# Patient Record
Sex: Male | Born: 1985 | Race: White | Hispanic: No | Marital: Married | Smoking: Never smoker
Health system: Southern US, Community
[De-identification: ages and names within clinical notes are randomized; demographics above are authoritative.]

## PROBLEM LIST (undated history)

## (undated) DIAGNOSIS — R7301 Impaired fasting glucose: Secondary | ICD-10-CM

## (undated) DIAGNOSIS — E785 Hyperlipidemia, unspecified: Secondary | ICD-10-CM

## (undated) DIAGNOSIS — E7800 Pure hypercholesterolemia, unspecified: Secondary | ICD-10-CM

## (undated) HISTORY — DX: Impaired fasting glucose: R73.01

## (undated) HISTORY — DX: Pure hypercholesterolemia, unspecified: E78.00

## (undated) HISTORY — PX: HERNIA REPAIR: SHX51

## (undated) HISTORY — DX: Hyperlipidemia, unspecified: E78.5

---
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
## 2009-04-03 DEATH — deceased

## 2012-05-06 ENCOUNTER — Telehealth: Payer: Self-pay

## 2012-05-06 NOTE — Telephone Encounter (Signed)
PATIENT CALLED REQUESTING A COPY OF IMMUNIZATIONS AND PHYSICAL FORM FROM LAST YEAR (FOR EMS IN [facility]) THANK YOU! BEST NUMBER IS [phone]. PATIENT WORKS FOR [employer] AND SAYS HE NEEDS IT BY  TODAY IF POSSIBLE.

## 2012-05-06 NOTE — Telephone Encounter (Signed)
Left message for patient to call back  

## 2012-05-31 ENCOUNTER — Encounter (INDEPENDENT_AMBULATORY_CARE_PROVIDER_SITE_OTHER): Payer: Self-pay | Admitting: Surgery

## 2012-05-31 ENCOUNTER — Ambulatory Visit (INDEPENDENT_AMBULATORY_CARE_PROVIDER_SITE_OTHER): Payer: Worker's Compensation | Admitting: Surgery

## 2012-05-31 VITALS — BP 118/81 | HR 71 | Temp 98.2°F | Resp 14 | Ht 68.0 in | Wt 203.6 lb

## 2012-05-31 DIAGNOSIS — K429 Umbilical hernia without obstruction or gangrene: Secondary | ICD-10-CM | POA: Insufficient documentation

## 2012-05-31 NOTE — Progress Notes (Signed)
Patient ID: Ronnie Baker, male   DOB: 1986/04/06, 27 y.o.   MRN: [id number]  Chief Complaint  Patient presents with  . Hernia    HPI Ronnie Baker is a 27 y.o. male.   HPI This is a very pleasant gentleman who presents today for evaluation of an umbilical hernia. He  Is referred by [name] from urgent care. 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 He has had discomfort there since a lifting episode. The pain is sharp and moderate in intensity. He has had no nausea, vomiting, or obstructive symptoms. He has had bilateral inguinal hernias repaired as a child. History reviewed. No pertinent past medical history.  Past Surgical History  Procedure Date  . Hernia repair     x 2 hernia    History reviewed. No pertinent family history.  Social History History  Substance Use Topics  . Smoking status: Never Smoker   . Smokeless tobacco: Not on file  . Alcohol Use: Yes     Comment: rare    No Known Allergies  No current outpatient prescriptions on file.    Review of Systems Review of Systems  Constitutional: Negative for fever, chills and unexpected weight change.  HENT: Negative for hearing loss, congestion, sore throat, trouble swallowing and voice change.   Eyes: Negative for visual disturbance.  Respiratory: Negative for cough and wheezing.   Cardiovascular: Negative for chest pain, palpitations and leg swelling.  Gastrointestinal: Positive for abdominal pain. Negative for nausea, vomiting, diarrhea, constipation, blood in stool, abdominal distention, anal bleeding and rectal pain.  Genitourinary: Negative for hematuria and difficulty urinating.  Musculoskeletal: Negative for arthralgias.  Skin: Negative for rash and wound.  Neurological: Negative for seizures, syncope, weakness and headaches.  Hematological: Negative for adenopathy. Does not bruise/bleed easily.  Psychiatric/Behavioral: Negative for confusion.    Blood pressure  118/81, pulse 71, temperature 98.2 F (36.8 C), temperature source Temporal, resp. rate 14, height 5\' 8"  (1.727 m), weight 203 lb 9.6 oz (92.352 kg).  Physical Exam Physical Exam  Constitutional: He is oriented to person, place, and time. He appears well-developed and well-nourished. No distress.  Eyes: Conjunctivae normal are normal. Pupils are equal, round, and reactive to light. Right eye exhibits no discharge. Left eye exhibits no discharge. Scleral icterus is present.  Neck: Normal range of motion. Neck supple.  Cardiovascular: Normal rate, regular rhythm, normal heart sounds and intact distal pulses.   No murmur heard. Pulmonary/Chest: Breath sounds normal. No respiratory distress. He has no wheezes.  Abdominal: Soft. Bowel sounds are normal. He exhibits no distension. There is no tenderness.       There is a small, reducible hernia at the umbilicus  Neurological: He is alert and oriented to person, place, and time.  Skin: Skin is warm and dry. He is not diaphoretic.  Psychiatric: His behavior is normal. Judgment normal.    Data Reviewed   Assessment    Umbilical hernia    Plan    As he is symptomatic he does a significant amount of lifting, repair with mesh was recommended. I discussed this with him in detail. I discussed the risks of surgery which includes but is not limited to bleeding, infection, recurrence, et [name]. He understands and wishes to proceed. Likelihood of success is good.       [name] A 05/31/2012, 11:28 AM

## 2012-06-01 ENCOUNTER — Telehealth (INDEPENDENT_AMBULATORY_CARE_PROVIDER_SITE_OTHER): Payer: Self-pay

## 2012-06-01 ENCOUNTER — Telehealth (INDEPENDENT_AMBULATORY_CARE_PROVIDER_SITE_OTHER): Payer: Self-pay | Admitting: General Surgery

## 2012-06-01 ENCOUNTER — Encounter (INDEPENDENT_AMBULATORY_CARE_PROVIDER_SITE_OTHER): Payer: Self-pay

## 2012-06-01 NOTE — Telephone Encounter (Signed)
Called patient to see about if he needed a note for work and he stated that he did get a note for work

## 2012-06-01 NOTE — Telephone Encounter (Signed)
Patient called in asking for note for work and asked if he needed restrictions. After speaking with Dr Magnus Ivan I told Bowdy he did not need restriction. He asked if we received approval for surgery from workers comp. I told him I would have someone check up on it and call him back.

## 2012-06-03 ENCOUNTER — Encounter (HOSPITAL_COMMUNITY): Payer: Self-pay | Admitting: Pharmacist

## 2012-06-07 ENCOUNTER — Encounter (HOSPITAL_COMMUNITY): Payer: Self-pay

## 2012-06-07 MED ORDER — CEFAZOLIN SODIUM-DEXTROSE 2-3 GM-% IV SOLR
2.0000 g | INTRAVENOUS | Status: AC
  Administered 2012-06-08: 2 g via INTRAVENOUS
  Filled 2012-06-07 (×3): qty 50

## 2012-06-08 ENCOUNTER — Encounter (HOSPITAL_COMMUNITY): Payer: Self-pay | Admitting: Anesthesiology

## 2012-06-08 ENCOUNTER — Encounter (HOSPITAL_COMMUNITY)
Admission: RE | Disposition: A | Discharge status: Home / Self Care | Payer: Self-pay | Source: Ambulatory Visit | Attending: Surgery

## 2012-06-08 ENCOUNTER — Ambulatory Visit (HOSPITAL_COMMUNITY)
Admission: RE | Admit: 2012-06-08 | Discharge: 2012-06-08 | Disposition: A | Discharge status: Home / Self Care | Payer: Worker's Compensation | Source: Ambulatory Visit | Attending: Surgery | Admitting: Surgery

## 2012-06-08 ENCOUNTER — Ambulatory Visit (HOSPITAL_COMMUNITY): Payer: Worker's Compensation | Admitting: Anesthesiology

## 2012-06-08 DIAGNOSIS — K420 Umbilical hernia with obstruction, without gangrene: Secondary | ICD-10-CM | POA: Insufficient documentation

## 2012-06-08 DIAGNOSIS — K429 Umbilical hernia without obstruction or gangrene: Secondary | ICD-10-CM

## 2012-06-08 HISTORY — PX: INSERTION OF MESH: SHX5868

## 2012-06-08 HISTORY — PX: UMBILICAL HERNIA REPAIR: SHX196

## 2012-06-08 LAB — CBC
Hemoglobin: 14.6 g/dL (ref 13.0–17.0)
MCH: 27.9 pg (ref 26.0–34.0)
MCV: 81.1 fL (ref 78.0–100.0)
RBC: 5.24 MIL/uL (ref 4.22–5.81)

## 2012-06-08 LAB — SURGICAL PCR SCREEN: MRSA, PCR: NEGATIVE

## 2012-06-08 SURGERY — REPAIR, HERNIA, UMBILICAL, ADULT
Anesthesia: General | Wound class: Clean

## 2012-06-08 MED ORDER — OXYCODONE HCL 5 MG/5ML PO SOLN: 5.0000 mg | Freq: Once | ORAL | Status: DC | PRN

## 2012-06-08 MED ORDER — PROPOFOL 10 MG/ML IV BOLUS
INTRAVENOUS | Status: DC | PRN
  Administered 2012-06-08: 200 mg via INTRAVENOUS

## 2012-06-08 MED ORDER — BUPIVACAINE HCL (PF) 0.25 % IJ SOLN
INTRAMUSCULAR | Status: DC | PRN
  Administered 2012-06-08: 18 mL

## 2012-06-08 MED ORDER — ACETAMINOPHEN 650 MG RE SUPP
650.0000 mg | RECTAL | Status: DC | PRN
  Filled 2012-06-08: qty 1

## 2012-06-08 MED ORDER — MEPERIDINE HCL 25 MG/ML IJ SOLN: 6.2500 mg | INTRAMUSCULAR | Status: DC | PRN

## 2012-06-08 MED ORDER — ACETAMINOPHEN 325 MG PO TABS
650.0000 mg | ORAL_TABLET | ORAL | Status: DC | PRN
  Filled 2012-06-08: qty 2

## 2012-06-08 MED ORDER — PROMETHAZINE HCL 25 MG/ML IJ SOLN: 6.2500 mg | INTRAMUSCULAR | Status: DC | PRN

## 2012-06-08 MED ORDER — ONDANSETRON HCL 4 MG/2ML IJ SOLN
4.0000 mg | Freq: Four times a day (QID) | INTRAMUSCULAR | Status: DC | PRN
  Administered 2012-06-08: 4 mg via INTRAVENOUS

## 2012-06-08 MED ORDER — KETOROLAC TROMETHAMINE 30 MG/ML IJ SOLN
INTRAMUSCULAR | Status: DC | PRN
  Administered 2012-06-08: 30 mg via INTRAVENOUS

## 2012-06-08 MED ORDER — MUPIROCIN 2 % EX OINT
TOPICAL_OINTMENT | CUTANEOUS | Status: AC
  Administered 2012-06-08: 1 via NASAL
  Filled 2012-06-08: qty 22

## 2012-06-08 MED ORDER — LACTATED RINGERS IV SOLN
INTRAVENOUS | Status: DC | PRN
  Administered 2012-06-08: 08:00:00 via INTRAVENOUS

## 2012-06-08 MED ORDER — HYDROMORPHONE HCL PF 1 MG/ML IJ SOLN: 0.2500 mg | INTRAMUSCULAR | Status: DC | PRN

## 2012-06-08 MED ORDER — MIDAZOLAM HCL 5 MG/5ML IJ SOLN
INTRAMUSCULAR | Status: DC | PRN
  Administered 2012-06-08: 2 mg via INTRAVENOUS

## 2012-06-08 MED ORDER — OXYCODONE HCL 5 MG PO TABS: 5.0000 mg | ORAL_TABLET | Freq: Once | ORAL | Status: DC | PRN

## 2012-06-08 MED ORDER — MUPIROCIN 2 % EX OINT
TOPICAL_OINTMENT | Freq: Once | CUTANEOUS | Status: DC
  Filled 2012-06-08: qty 22

## 2012-06-08 MED ORDER — HYDROCODONE-ACETAMINOPHEN 5-325 MG PO TABS: 1.0000 | ORAL_TABLET | ORAL | Status: DC | PRN

## 2012-06-08 MED ORDER — OXYCODONE HCL 5 MG PO TABS
5.0000 mg | ORAL_TABLET | ORAL | Status: DC | PRN
  Administered 2012-06-08: 10 mg via ORAL

## 2012-06-08 MED ORDER — LIDOCAINE HCL (CARDIAC) 20 MG/ML IV SOLN
INTRAVENOUS | Status: DC | PRN
  Administered 2012-06-08: 80 mg via INTRAVENOUS

## 2012-06-08 MED ORDER — HYDROMORPHONE HCL PF 1 MG/ML IJ SOLN
INTRAMUSCULAR | Status: AC
  Filled 2012-06-08: qty 1

## 2012-06-08 MED ORDER — BUPIVACAINE HCL (PF) 0.25 % IJ SOLN
INTRAMUSCULAR | Status: AC
  Filled 2012-06-08: qty 30

## 2012-06-08 MED ORDER — FENTANYL CITRATE 0.05 MG/ML IJ SOLN
INTRAMUSCULAR | Status: DC | PRN
  Administered 2012-06-08 (×2): 50 ug via INTRAVENOUS
  Administered 2012-06-08: 100 ug via INTRAVENOUS

## 2012-06-08 MED ORDER — OXYCODONE HCL 5 MG PO TABS
ORAL_TABLET | ORAL | Status: AC
  Filled 2012-06-08: qty 2

## 2012-06-08 MED ORDER — ONDANSETRON HCL 4 MG/2ML IJ SOLN
INTRAMUSCULAR | Status: AC
  Filled 2012-06-08: qty 2

## 2012-06-08 MED ORDER — ONDANSETRON HCL 4 MG/2ML IJ SOLN
INTRAMUSCULAR | Status: DC | PRN
  Administered 2012-06-08: 4 mg via INTRAVENOUS

## 2012-06-08 MED ORDER — SODIUM CHLORIDE 0.9 % IJ SOLN: 3.0000 mL | Freq: Two times a day (BID) | INTRAMUSCULAR | Status: DC

## 2012-06-08 MED ORDER — SODIUM CHLORIDE 0.9 % IJ SOLN: 3.0000 mL | INTRAMUSCULAR | Status: DC | PRN

## 2012-06-08 MED ORDER — MORPHINE SULFATE 4 MG/ML IJ SOLN: 4.0000 mg | INTRAMUSCULAR | Status: DC | PRN

## 2012-06-08 MED ORDER — SODIUM CHLORIDE 0.9 % IV SOLN: 250.0000 mL | INTRAVENOUS | Status: DC | PRN

## 2012-06-08 MED ORDER — 0.9 % SODIUM CHLORIDE (POUR BTL) OPTIME
TOPICAL | Status: DC | PRN
  Administered 2012-06-08: 1000 mL

## 2012-06-08 SURGICAL SUPPLY — 44 items
APL SKNCLS STERI-STRIP NONHPOA (GAUZE/BANDAGES/DRESSINGS) ×1
BENZOIN TINCTURE PRP APPL 2/3 (GAUZE/BANDAGES/DRESSINGS) ×2 IMPLANT
BLADE SURG 10 STRL SS (BLADE) ×1 IMPLANT
BLADE SURG 15 STRL LF DISP TIS (BLADE) IMPLANT
BLADE SURG 15 STRL SS (BLADE) ×2
BLADE SURG ROTATE 9660 (MISCELLANEOUS) ×1 IMPLANT
CANISTER SUCTION 2500CC (MISCELLANEOUS) ×1 IMPLANT
CHLORAPREP W/TINT 26ML (MISCELLANEOUS) ×2 IMPLANT
CLOTH BEACON ORANGE TIMEOUT ST (SAFETY) ×2 IMPLANT
COVER SURGICAL LIGHT HANDLE (MISCELLANEOUS) ×2 IMPLANT
DECANTER SPIKE VIAL GLASS SM (MISCELLANEOUS) ×2 IMPLANT
DRAPE PED LAPAROTOMY (DRAPES) ×2 IMPLANT
DRSG TEGADERM 4X4.75 (GAUZE/BANDAGES/DRESSINGS) ×2 IMPLANT
ELECT CAUTERY BLADE 6.4 (BLADE) ×2 IMPLANT
ELECT REM PT RETURN 9FT ADLT (ELECTROSURGICAL) ×2
ELECTRODE REM PT RTRN 9FT ADLT (ELECTROSURGICAL) ×1 IMPLANT
GAUZE SPONGE 2X2 8PLY STRL LF (GAUZE/BANDAGES/DRESSINGS) ×1 IMPLANT
GLOVE BIOGEL PI IND STRL 7.0 (GLOVE) IMPLANT
GLOVE BIOGEL PI INDICATOR 7.0 (GLOVE) ×2
GLOVE SURG SIGNA 7.5 PF LTX (GLOVE) ×2 IMPLANT
GLOVE SURG SS PI 7.0 STRL IVOR (GLOVE) ×1 IMPLANT
GOWN PREVENTION PLUS XLARGE (GOWN DISPOSABLE) ×2 IMPLANT
GOWN STRL NON-REIN LRG LVL3 (GOWN DISPOSABLE) ×2 IMPLANT
KIT BASIN OR (CUSTOM PROCEDURE TRAY) ×2 IMPLANT
KIT ROOM TURNOVER OR (KITS) ×2 IMPLANT
NDL HYPO 25GX1X1/2 BEV (NEEDLE) ×1 IMPLANT
NEEDLE HYPO 25GX1X1/2 BEV (NEEDLE) ×2 IMPLANT
NS IRRIG 1000ML POUR BTL (IV SOLUTION) ×2 IMPLANT
PACK SURGICAL SETUP 50X90 (CUSTOM PROCEDURE TRAY) ×2 IMPLANT
PAD ARMBOARD 7.5X6 YLW CONV (MISCELLANEOUS) ×2 IMPLANT
PATCH VENTRAL SMALL 4.3 (Mesh Specialty) ×1 IMPLANT
PENCIL BUTTON HOLSTER BLD 10FT (ELECTRODE) ×2 IMPLANT
SPONGE GAUZE 2X2 STER 10/PKG (GAUZE/BANDAGES/DRESSINGS) ×1
STRIP CLOSURE SKIN 1/2X4 (GAUZE/BANDAGES/DRESSINGS) ×2 IMPLANT
SUT MNCRL AB 4-0 PS2 18 (SUTURE) ×2 IMPLANT
SUT NOVA NAB DX-16 0-1 5-0 T12 (SUTURE) ×3 IMPLANT
SUT VIC AB 3-0 SH 27 (SUTURE) ×2
SUT VIC AB 3-0 SH 27X BRD (SUTURE) ×1 IMPLANT
SYR BULB 3OZ (MISCELLANEOUS) ×1 IMPLANT
SYR CONTROL 10ML LL (SYRINGE) ×2 IMPLANT
TOWEL OR 17X24 6PK STRL BLUE (TOWEL DISPOSABLE) ×2 IMPLANT
TOWEL OR 17X26 10 PK STRL BLUE (TOWEL DISPOSABLE) ×2 IMPLANT
TUBE CONNECTING 12X1/4 (SUCTIONS) ×1 IMPLANT
YANKAUER SUCT BULB TIP NO VENT (SUCTIONS) ×1 IMPLANT

## 2012-06-08 NOTE — Interval H&P Note (Signed)
History and Physical Interval Note:  06/08/2012 7:20 AM  Ronnie Baker  has presented today for surgery, with the diagnosis of umbilical hernia  The various methods of treatment have been discussed with the patient and family. After consideration of risks, benefits and other options for treatment, the patient has consented to  Procedure(s) (LRB) with comments: HERNIA REPAIR UMBILICAL ADULT (N/A) INSERTION OF MESH (N/A) as a surgical intervention .  The patient's history has been reviewed, patient examined, no change in status, stable for surgery.  I have reviewed the patient's chart and labs.  Questions were answered to the patient's satisfaction.     Kaylin Schellenberg A

## 2012-06-08 NOTE — Transfer of Care (Signed)
Immediate Anesthesia Transfer of Care Note  Patient: Ronnie Baker  Procedure(s) Performed: Procedure(s) (LRB) with comments: HERNIA REPAIR UMBILICAL ADULT (N/A) INSERTION OF MESH (N/A)  Patient Location: PACU  Anesthesia Type:General  Level of Consciousness: awake, sedated and patient cooperative  Airway & Oxygen Therapy: Patient Spontanous Breathing and Patient connected to nasal cannula oxygen  Post-op Assessment: Report given to PACU RN, Patient moving all extremities and Patient moving all extremities X 4  Post vital signs: Reviewed and stable  Complications: No apparent anesthesia complications

## 2012-06-08 NOTE — Anesthesia Preprocedure Evaluation (Signed)
Anesthesia Evaluation  Patient identified by MRN, date of birth, ID band Patient awake    Reviewed: Allergy & Precautions, H&P , NPO status , Patient's Chart, lab work & pertinent test results  History of Anesthesia Complications Negative for: history of anesthetic complications  Airway Mallampati: I      Dental No notable dental hx. (+) Teeth Intact   Pulmonary neg pulmonary ROS,  breath sounds clear to auscultation  Pulmonary exam normal       Cardiovascular negative cardio ROS  IRhythm:regular Rate:Normal     Neuro/Psych negative neurological ROS  negative psych ROS   GI/Hepatic negative GI ROS, Neg liver ROS,   Endo/Other  negative endocrine ROS  Renal/GU negative Renal ROS  negative genitourinary   Musculoskeletal   Abdominal   Peds  Hematology negative hematology ROS (+)   Anesthesia Other Findings   Reproductive/Obstetrics negative OB ROS                           Anesthesia Physical Anesthesia Plan  ASA: I  Anesthesia Plan: General and General LMA   Post-op Pain Management:    Induction:   Airway Management Planned:   Additional Equipment:   Intra-op Plan:   Post-operative Plan:   Informed Consent: I have reviewed the patients History and Physical, chart, labs and discussed the procedure including the risks, benefits and alternatives for the proposed anesthesia with the patient or authorized representative who has indicated his/her understanding and acceptance.     Plan Discussed with: CRNA and Surgeon  Anesthesia Plan Comments:         Anesthesia Quick Evaluation

## 2012-06-08 NOTE — Anesthesia Procedure Notes (Signed)
Procedure Name: LMA Insertion Date/Time: 06/08/2012 8:33 AM Performed by: Coralee Rud Pre-anesthesia Checklist: Patient identified, Emergency Drugs available, Suction available and Patient being monitored Patient Re-evaluated:Patient Re-evaluated prior to inductionOxygen Delivery Method: Circle system utilized Preoxygenation: Pre-oxygenation with 100% oxygen Intubation Type: IV induction Ventilation: Mask ventilation without difficulty LMA: LMA inserted LMA Size: 5.0 Number of attempts: 1 Dental Injury: Teeth and Oropharynx as per pre-operative assessment

## 2012-06-08 NOTE — H&P (Signed)
  Patient ID: Ronnie Baker, male DOB: [birth year], 27 y.o. MRN: [id number]  Chief Complaint   Patient presents with   .  Hernia    HPI  Ronnie Baker is a 27 y.o. male.  HPI  This is a very pleasant gentleman who presents today for evaluation of an umbilical hernia. He Is referred by [name] from urgent care. 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 He has had discomfort there since a lifting episode. The pain is sharp and moderate in intensity. He has had no nausea, vomiting, or obstructive symptoms. He has had bilateral inguinal hernias repaired as a child.  History reviewed. No pertinent past medical history.  Past Surgical History   Procedure  Date   .  Hernia repair      x 2 hernia    History reviewed. No pertinent family history.  Social History  History   Substance Use Topics   .  Smoking status:  Never Smoker   .  Smokeless tobacco:  Not on file   .  Alcohol Use:  Yes      Comment: rare    No Known Allergies  No current outpatient prescriptions on file.    Review of Systems  Review of Systems  Constitutional: Negative for fever, chills and unexpected weight change.  HENT: Negative for hearing loss, congestion, sore throat, trouble swallowing and voice change.  Eyes: Negative for visual disturbance.  Respiratory: Negative for cough and wheezing.  Cardiovascular: Negative for chest pain, palpitations and leg swelling.  Gastrointestinal: Positive for abdominal pain. Negative for nausea, vomiting, diarrhea, constipation, blood in stool, abdominal distention, anal bleeding and rectal pain.  Genitourinary: Negative for hematuria and difficulty urinating.  Musculoskeletal: Negative for arthralgias.  Skin: Negative for rash and wound.  Neurological: Negative for seizures, syncope, weakness and headaches.  Hematological: Negative for adenopathy. Does not bruise/bleed easily.  Psychiatric/Behavioral: Negative for confusion.   Blood  pressure 118/81, pulse 71, temperature 98.2 F (36.8 C), temperature source Temporal, resp. rate 14, height 5\' 8"  (1.727 m), weight 203 lb 9.6 oz (92.352 kg).  Physical Exam  Physical Exam  Constitutional: He is oriented to person, place, and time. He appears well-developed and well-nourished. No distress.  Eyes: Conjunctivae normal are normal. Pupils are equal, round, and reactive to light. Right eye exhibits no discharge. Left eye exhibits no discharge. Scleral icterus is present.  Neck: Normal range of motion. Neck supple.  Cardiovascular: Normal rate, regular rhythm, normal heart sounds and intact distal pulses.  No murmur heard.  Pulmonary/Chest: Breath sounds normal. No respiratory distress. He has no wheezes.  Abdominal: Soft. Bowel sounds are normal. He exhibits no distension. There is no tenderness.  There is a small, reducible hernia at the umbilicus  Neurological: He is alert and oriented to person, place, and time.  Skin: Skin is warm and dry. He is not diaphoretic.  Psychiatric: His behavior is normal. Judgment normal.   Data Reviewed  Assessment   Umbilical hernia   Plan   As he is symptomatic he does a significant amount of lifting, repair with mesh was recommended. I discussed this with him in detail. I discussed the risks of surgery which includes but is not limited to bleeding, infection, recurrence, et [name]. He understands and wishes to proceed. Likelihood of success is good.   Caiden Arteaga A

## 2012-06-08 NOTE — Op Note (Signed)
HERNIA REPAIR UMBILICAL ADULT, INSERTION OF MESH  Procedure Note  Ronnie Baker 06/08/2012   Pre-op Diagnosis: umbilical hernia     Post-op Diagnosis: same  Procedure(s): HERNIA REPAIR UMBILICAL ADULT INSERTION OF MESH (4.3cm round V-patch)  Surgeon(s): Shelly Rubenstein, MD  Anesthesia: General  Staff:  Sudie Bailey, RN - Circulator Leighton Parody - Scrub Person  Estimated Blood Loss: Minimal               Procedure: The patient was brought to the operating room and identified as the correct patient. He was placed supine on the operating room table and general anesthesia was induced. His abdomen was then prepped and draped in the usual sterile fashion. I anesthetized the skin at the lower edge of the umbilicus with Marcaine. I then made Baker small transverse incision in the lower edge of the umbilicus with Baker scalpel. I took incision down to the fascia with the electrocautery. The patient had Baker very small less than 1 cm fascial defect with incarcerated preperitoneal fat which I excised. I then brought Baker 4.3 cm round V-patch onto the field. I placed the mesh through the fascial defect and then pulled up taut against the peritoneal surface with the stay ties. I then sewed the mesh in circumferentially with #1 Novafil sutures. I then cut the stay ties and closed the fascia over the top of the mesh with figure-of-eight #1 Novafil sutures. I anesthetized the fascia further with Marcaine. I then closed subcutaneous tissue with interrupted 3-0 Vicryl sutures and closed skin with Baker running 4-0 Monocryl. Steri-Strips, gauze, and and Tegaderm were then applied. The patient tolerated the procedure well. All counts were correct at the end of the procedure. The patient was then extubated in the operating room and taken in Baker stable condition to the recovery room.          Ronnie Baker   Date: 06/08/2012  Time: 9:12 AM

## 2012-06-08 NOTE — Anesthesia Postprocedure Evaluation (Signed)
  Anesthesia Post-op Note  Patient: Ronnie Baker  Procedure(s) Performed: Procedure(s) (LRB) with comments: HERNIA REPAIR UMBILICAL ADULT (N/A) INSERTION OF MESH (N/A)  Patient Location: PACU  Anesthesia Type:General  Level of Consciousness: awake  Airway and Oxygen Therapy: Patient Spontanous Breathing  Post-op Pain: mild  Post-op Assessment: Post-op Vital signs reviewed  Post-op Vital Signs: stable  Complications: No apparent anesthesia complications

## 2012-06-10 ENCOUNTER — Encounter (HOSPITAL_COMMUNITY): Payer: Self-pay | Admitting: Surgery

## 2012-06-14 ENCOUNTER — Ambulatory Visit (INDEPENDENT_AMBULATORY_CARE_PROVIDER_SITE_OTHER): Payer: Worker's Compensation | Admitting: Surgery

## 2012-06-14 VITALS — BP 138/84 | HR 95 | Temp 97.0°F | Resp 18 | Ht 68.0 in | Wt 200.0 lb

## 2012-06-14 DIAGNOSIS — Z09 Encounter for follow-up examination after completed treatment for conditions other than malignant neoplasm: Secondary | ICD-10-CM

## 2012-06-14 NOTE — Progress Notes (Signed)
Subjective:     Patient ID: Ronnie Baker, male   DOB: 02/16/1986, 27 y.o.   MRN: [id number]  HPI He is here for his first postop visit status post umbilical hernia repair with mesh last week. He is doing well and has minimal discomfort  Review of Systems     Objective:   Physical Exam On exam, his incision is well healed with no evidence of recurrence or infection    Assessment:     Patient status post umbilical hernia repair with mesh     Plan:     He may return to work tomorrow to light activity. He will do no lifting greater than 15-20 pounds. He may return to full activity on March 5

## 2012-07-12 ENCOUNTER — Ambulatory Visit (INDEPENDENT_AMBULATORY_CARE_PROVIDER_SITE_OTHER): Payer: Worker's Compensation | Admitting: Surgery

## 2012-07-12 ENCOUNTER — Encounter (INDEPENDENT_AMBULATORY_CARE_PROVIDER_SITE_OTHER): Payer: Self-pay | Admitting: Surgery

## 2012-07-12 VITALS — BP 120/84 | HR 72 | Temp 97.0°F | Resp 16 | Ht 68.0 in | Wt 201.2 lb

## 2012-07-12 DIAGNOSIS — Z09 Encounter for follow-up examination after completed treatment for conditions other than malignant neoplasm: Secondary | ICD-10-CM

## 2012-07-12 NOTE — Progress Notes (Signed)
Subjective:     Patient ID: Ronnie Baker, male   DOB: 09-02-1985, 27 y.o.   MRN: [id number]  HPI He is here for his final postop check status post umbilical hernia repair with mesh in February. 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 He still has some mild discomfort.  Review of Systems     Objective:   Physical Exam On exam, his incision is well healed and there is no evidence of recurrent hernia    Assessment:     Patient stable postop     Plan:     He makes now return to full activities without limitations. I will see him back as needed

## 2013-06-11 ENCOUNTER — Ambulatory Visit (INDEPENDENT_AMBULATORY_CARE_PROVIDER_SITE_OTHER): Payer: 59 | Admitting: Emergency Medicine

## 2013-06-11 ENCOUNTER — Emergency Department (HOSPITAL_COMMUNITY)
Admission: EM | Admit: 2013-06-11 | Discharge: 2013-06-11 | Disposition: A | Discharge status: Home / Self Care | Payer: 59 | Attending: Emergency Medicine | Admitting: Emergency Medicine

## 2013-06-11 ENCOUNTER — Emergency Department (HOSPITAL_COMMUNITY): Payer: 59

## 2013-06-11 ENCOUNTER — Encounter (HOSPITAL_COMMUNITY): Payer: Self-pay | Admitting: Emergency Medicine

## 2013-06-11 VITALS — BP 112/78 | HR 113 | Temp 98.9°F | Resp 20 | Ht 67.0 in | Wt 200.4 lb

## 2013-06-11 DIAGNOSIS — R509 Fever, unspecified: Secondary | ICD-10-CM | POA: Insufficient documentation

## 2013-06-11 DIAGNOSIS — R109 Unspecified abdominal pain: Secondary | ICD-10-CM

## 2013-06-11 DIAGNOSIS — Z79899 Other long term (current) drug therapy: Secondary | ICD-10-CM | POA: Insufficient documentation

## 2013-06-11 DIAGNOSIS — R0602 Shortness of breath: Secondary | ICD-10-CM | POA: Insufficient documentation

## 2013-06-11 DIAGNOSIS — R112 Nausea with vomiting, unspecified: Secondary | ICD-10-CM | POA: Insufficient documentation

## 2013-06-11 DIAGNOSIS — Z9889 Other specified postprocedural states: Secondary | ICD-10-CM | POA: Insufficient documentation

## 2013-06-11 DIAGNOSIS — R197 Diarrhea, unspecified: Secondary | ICD-10-CM

## 2013-06-11 DIAGNOSIS — R1031 Right lower quadrant pain: Secondary | ICD-10-CM | POA: Insufficient documentation

## 2013-06-11 DIAGNOSIS — R1032 Left lower quadrant pain: Secondary | ICD-10-CM | POA: Insufficient documentation

## 2013-06-11 LAB — POCT CBC
Granulocyte percent: 87.5 %G — AB (ref 37–80)
HCT, POC: 51.7 % (ref 43.5–53.7)
Hemoglobin: 16.5 g/dL (ref 14.1–18.1)
LYMPH, POC: 1 (ref 0.6–3.4)
MCH: 27.8 pg (ref 27–31.2)
MCHC: 31.9 g/dL (ref 31.8–35.4)
MCV: 87.1 fL (ref 80–97)
MID (CBC): 0.5 (ref 0–0.9)
MPV: 8.9 fL (ref 0–99.8)
PLATELET COUNT, POC: 268 10*3/uL (ref 142–424)
POC Granulocyte: 10.7 — AB (ref 2–6.9)
POC LYMPH %: 8.1 % — AB (ref 10–50)
POC MID %: 4.4 % (ref 0–12)
RBC: 5.94 M/uL (ref 4.69–6.13)
RDW, POC: 13.7 %
WBC: 12.2 10*3/uL — AB (ref 4.6–10.2)

## 2013-06-11 LAB — CBC WITH DIFFERENTIAL/PLATELET
Basophils Absolute: 0 10*3/uL (ref 0.0–0.1)
Basophils Relative: 0 % (ref 0–1)
EOS PCT: 0 % (ref 0–5)
Eosinophils Absolute: 0 10*3/uL (ref 0.0–0.7)
HEMATOCRIT: 47.2 % (ref 39.0–52.0)
Hemoglobin: 16.2 g/dL (ref 13.0–17.0)
LYMPHS ABS: 0.5 10*3/uL — AB (ref 0.7–4.0)
LYMPHS PCT: 4 % — AB (ref 12–46)
MCH: 27.8 pg (ref 26.0–34.0)
MCHC: 34.3 g/dL (ref 30.0–36.0)
MCV: 81.1 fL (ref 78.0–100.0)
MONO ABS: 0.6 10*3/uL (ref 0.1–1.0)
Monocytes Relative: 5 % (ref 3–12)
Neutro Abs: 11.2 10*3/uL — ABNORMAL HIGH (ref 1.7–7.7)
Neutrophils Relative %: 90 % — ABNORMAL HIGH (ref 43–77)
Platelets: 234 10*3/uL (ref 150–400)
RBC: 5.82 MIL/uL — AB (ref 4.22–5.81)
RDW: 13.1 % (ref 11.5–15.5)
WBC: 12.3 10*3/uL — AB (ref 4.0–10.5)

## 2013-06-11 LAB — LIPASE, BLOOD: Lipase: 21 U/L (ref 11–59)

## 2013-06-11 LAB — COMPREHENSIVE METABOLIC PANEL
ALT: 30 U/L (ref 0–53)
AST: 21 U/L (ref 0–37)
Albumin: 4.9 g/dL (ref 3.5–5.2)
Alkaline Phosphatase: 86 U/L (ref 39–117)
BUN: 18 mg/dL (ref 6–23)
CALCIUM: 9.8 mg/dL (ref 8.4–10.5)
CO2: 24 meq/L (ref 19–32)
CREATININE: 1.09 mg/dL (ref 0.50–1.35)
Chloride: 98 mEq/L (ref 96–112)
GFR calc Af Amer: >90 mL/min (ref >90)
Glucose, Bld: 94 mg/dL (ref 70–99)
Potassium: 4 mEq/L (ref 3.7–5.3)
Sodium: 138 mEq/L (ref 137–147)
Total Bilirubin: 0.6 mg/dL (ref 0.3–1.2)
Total Protein: 8.8 g/dL — ABNORMAL HIGH (ref 6.0–8.3)

## 2013-06-11 LAB — URINALYSIS, ROUTINE W REFLEX MICROSCOPIC
Bilirubin Urine: NEGATIVE
Glucose, UA: NEGATIVE mg/dL
HGB URINE DIPSTICK: NEGATIVE
KETONES UR: NEGATIVE mg/dL
Leukocytes, UA: NEGATIVE
Nitrite: NEGATIVE
Protein, ur: NEGATIVE mg/dL
Specific Gravity, Urine: >1.046 — ABNORMAL HIGH (ref 1.005–1.030)
UROBILINOGEN UA: 0.2 mg/dL (ref 0.0–1.0)
pH: 7 (ref 5.0–8.0)

## 2013-06-11 MED ORDER — HYDROCODONE-ACETAMINOPHEN 5-325 MG PO TABS: 1.0000 | ORAL_TABLET | Freq: Four times a day (QID) | ORAL | Status: DC | PRN

## 2013-06-11 MED ORDER — SODIUM CHLORIDE 0.9 % IV BOLUS (SEPSIS)
1000.0000 mL | Freq: Once | INTRAVENOUS | Status: AC
  Administered 2013-06-11: 1000 mL via INTRAVENOUS

## 2013-06-11 MED ORDER — MORPHINE SULFATE 4 MG/ML IJ SOLN
4.0000 mg | Freq: Once | INTRAMUSCULAR | Status: AC
  Administered 2013-06-11: 4 mg via INTRAVENOUS
  Filled 2013-06-11: qty 1

## 2013-06-11 MED ORDER — ACETAMINOPHEN 500 MG PO TABS
1000.0000 mg | ORAL_TABLET | Freq: Once | ORAL | Status: AC
  Administered 2013-06-11: 1000 mg via ORAL
  Filled 2013-06-11: qty 2

## 2013-06-11 MED ORDER — IOHEXOL 300 MG/ML  SOLN
100.0000 mL | Freq: Once | INTRAMUSCULAR | Status: AC | PRN
  Administered 2013-06-11: 100 mL via INTRAVENOUS

## 2013-06-11 MED ORDER — ONDANSETRON HCL 4 MG/2ML IJ SOLN
4.0000 mg | Freq: Once | INTRAMUSCULAR | Status: AC
  Administered 2013-06-11: 4 mg via INTRAVENOUS
  Filled 2013-06-11: qty 2

## 2013-06-11 MED ORDER — ONDANSETRON HCL 4 MG PO TABS: 4.0000 mg | ORAL_TABLET | Freq: Four times a day (QID) | ORAL | Status: DC

## 2013-06-11 MED ORDER — IOHEXOL 300 MG/ML  SOLN
50.0000 mL | Freq: Once | INTRAMUSCULAR | Status: AC | PRN
  Administered 2013-06-11: 50 mL via ORAL

## 2013-06-11 MED ORDER — ONDANSETRON 4 MG PO TBDP
8.0000 mg | ORAL_TABLET | Freq: Once | ORAL | Status: AC
  Administered 2013-06-11: 8 mg via ORAL

## 2013-06-11 MED ORDER — DICYCLOMINE HCL 10 MG/ML IM SOLN
20.0000 mg | Freq: Once | INTRAMUSCULAR | Status: AC
  Administered 2013-06-11: 20 mg via INTRAMUSCULAR
  Filled 2013-06-11: qty 2

## 2013-06-11 NOTE — ED Provider Notes (Signed)
CSN: [id number]     Arrival date & time 06/11/13  1405 History   First MD Initiated Contact with Patient 06/11/13 1507     Chief Complaint  Patient presents with  . Abdominal Cramping   (Consider location/radiation/quality/duration/timing/severity/associated sxs/prior Treatment) HPI Comments: Patient is a 28 year old male with history of umbilical hernia repair who presents today with abdominal pain since 9 AM this morning. He reports that the pain began after a bowel movement. Initially his bowel movement was normal other than a "sick odor". Since that time he has had 3 episodes of watery diarrhea. He has associated nausea and vomiting. His abdominal pain is crampy and worse in his lower quadrants. It also has an achy quality. The pain radiates to his back. He has never had pain like this in the past. He was seen by urgent care and sent here for further evaluation. He admits to having a low-grade fever and chills. He has discomfort with breathing because it hurts his abdomen. He denies any chest pain.  Patient is a 28 y.o. male presenting with cramps. The history is provided by the patient. No language interpreter was used.  Abdominal Cramping Associated symptoms include abdominal pain, chills, a fever, nausea and vomiting. Pertinent negatives include no chest pain.    History reviewed. No pertinent past medical history. Past Surgical History  Procedure Laterality Date  . Hernia repair      x 2 hernia  . Umbilical hernia repair  06/08/2012    Procedure: HERNIA REPAIR UMBILICAL ADULT;  Surgeon: Shelly Rubensteinouglas A Blackman, MD;  Location: [facility] OR;  Service: General;  Laterality: N/A;  . Insertion of mesh  06/08/2012    Procedure: INSERTION OF MESH;  Surgeon: Shelly Rubensteinouglas A Blackman, MD;  Location: [facility] OR;  Service: General;  Laterality: N/A;   Family History  Problem Relation Age of Onset  . Cancer Maternal Grandmother     lung  . Cancer Maternal Grandfather     gastric   History  Substance Use Topics  .  Smoking status: Never Smoker   . Smokeless tobacco: Not on file  . Alcohol Use: Yes     Comment: rare    Review of Systems  Constitutional: Positive for fever and chills.  Respiratory: Positive for shortness of breath (due to associated abdominal pain).   Cardiovascular: Negative for chest pain.  Gastrointestinal: Positive for nausea, vomiting, abdominal pain and diarrhea.  Genitourinary: Negative for dysuria, urgency, frequency, difficulty urinating and testicular pain.  All other systems reviewed and are negative.    Allergies  Bee venom  Home Medications   Current Outpatient Rx  Name  Route  Sig  Dispense  Refill  . meclizine (ANTIVERT) 25 MG tablet   Oral   Take 25 mg by mouth 2 (two) times daily.         . ondansetron (ZOFRAN) 8 MG tablet   Oral   Take 16 mg by mouth every 8 (eight) hours as needed for nausea or vomiting.          BP 122/77  Pulse 99  Temp(Src) 98.3 F (36.8 C) (Oral)  Resp 21  SpO2 98% Physical Exam  Nursing note and vitals reviewed. Constitutional: He is oriented to person, place, and time. He appears well-developed and well-nourished. He does not appear ill. No distress.  HENT:  Head: Normocephalic and atraumatic.  Right Ear: External ear normal.  Left Ear: External ear normal.  Nose: Nose normal.  Eyes: Conjunctivae are normal.  Neck: Normal range  of motion. No tracheal deviation present.  Cardiovascular: Normal rate, regular rhythm and normal heart sounds.   Pulmonary/Chest: Effort normal and breath sounds normal. No stridor.  Abdominal: Soft. He exhibits no distension. Bowel sounds are decreased. There is tenderness in the right lower quadrant, suprapubic area and left lower quadrant. There is guarding (voluntary). There is no rigidity and no rebound.  Musculoskeletal: Normal range of motion.  Neurological: He is alert and oriented to person, place, and time.  Skin: Skin is warm and dry. He is not diaphoretic.  Psychiatric: He has  a normal mood and affect. His behavior is normal.    ED Course  Procedures (including critical care time) Labs Review Labs Reviewed  CBC WITH DIFFERENTIAL - Abnormal; Notable for the following:    WBC 12.3 (*)    RBC 5.82 (*)    Neutrophils Relative % 90 (*)    Neutro Abs 11.2 (*)    Lymphocytes Relative 4 (*)    Lymphs Abs 0.5 (*)    All other components within normal limits  COMPREHENSIVE METABOLIC PANEL - Abnormal; Notable for the following:    Total Protein 8.8 (*)    All other components within normal limits  URINALYSIS, ROUTINE W REFLEX MICROSCOPIC - Abnormal; Notable for the following:    Specific Gravity, Urine >1.046 (*)    All other components within normal limits  LIPASE, BLOOD   Imaging Review Ct Abdomen Pelvis W Contrast  06/11/2013   CLINICAL DATA:  Abdomen pain  EXAM: CT ABDOMEN AND PELVIS WITH CONTRAST  TECHNIQUE: Multidetector CT imaging of the abdomen and pelvis was performed using the standard protocol following bolus administration of intravenous contrast.  CONTRAST:  50mL OMNIPAQUE IOHEXOL 300 MG/ML SOLN, OMNIPAQUE IOHEXOL 300 MG/ML SOLN  COMPARISON:  None.  FINDINGS: The liver, spleen, pancreas, gallbladder, adrenal glands and kidneys are normal. There is no hydronephrosis bilaterally. The aorta is normal. There is no abdominal lymphadenopathy. There is no small bowel obstruction or diverticulitis. The appendix is normal. There is a small hiatal hernia.  Fluid-filled bladder is normal. Prostate calcification is identified. There is no pelvic lymphadenopathy. Minimal dependent atelectasis of posterior right lung base is identified. No acute abnormalities identified within the visualized bones.  IMPRESSION: No acute abnormality identified in the abdomen and pelvis. There is no bowel obstruction. The appendix is normal.   Electronically Signed   By: Sherian Rein M.D.   On: 06/11/2013 17:15    EKG Interpretation   None       MDM   1. Abdominal pain   2.  Nausea vomiting and diarrhea    Patient is nontoxic, nonseptic appearing, in no apparent distress.  Patient's pain and other symptoms adequately managed in emergency department.  Fluid bolus given.  Labs, imaging and vitals reviewed.  Patient does not meet the SIRS or Sepsis criteria.  On repeat exam patient does not have a surgical abdomin and there are nor peritoneal signs.  No indication of appendicitis, bowel obstruction, bowel perforation, cholecystitis, diverticulitis.  Patient discharged home with symptomatic treatment and given strict instructions for follow-up with their primary care physician.  I have also discussed reasons to return immediately to the ER.  Patient expresses understanding and agrees with plan. Discussed case with Dr. Micheline Maze who agrees with plan.       Mora Bellman, PA-C 06/11/13 2117

## 2013-06-11 NOTE — Progress Notes (Addendum)
Subjective:    Patient ID: Jiles CrockerJoshua T Qazi, male    DOB: 27-Jun-1985, 28 y.o.   MRN: [id number]  HPI This chart was scribed for Viviann SpareSteven Daub-MD, by Ladona Ridgelaylor Day, Scribe. This patient was seen in room 4 and the patient's care was started at 1:12 PM.  HPI Comments: Jiles CrockerJoshua T Vazguez is a 28 y.o. male who presents to the [facility] complaining of severe 7/10 cramping abdominal pain, radiates to his back onset this AM. He has had total of x1 emesis episode and x3 episodes of watery diarrhea today. Ate nothing for lunch, but vomited his stomach contents from dinner last PM. He denies fever. He states some dizziness before his emesis episode while in the waiting room today. 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  Denies any med hx.  Patient Active Problem List   Diagnosis Date Noted  . Umbilical hernia 05/31/2012   Past Surgical History  Procedure Laterality Date  . Hernia repair      x 2 hernia  . Umbilical hernia repair  06/08/2012    Procedure: HERNIA REPAIR UMBILICAL ADULT;  Surgeon: Shelly Rubensteinouglas A Blackman, MD;  Location: [facility] OR;  Service: General;  Laterality: N/A;  . Insertion of mesh  06/08/2012    Procedure: INSERTION OF MESH;  Surgeon: Shelly Rubensteinouglas A Blackman, MD;  Location: [facility] OR;  Service: General;  Laterality: N/A;   Family History  Problem Relation Age of Onset  . Cancer Maternal Grandmother     lung  . Cancer Maternal Grandfather     gastric   History   Social History  . Marital Status: Single    Spouse Name: N/A    Number of Children: N/A  . Years of Education: N/A   Occupational History  . Not on file.   Social History Main Topics  . Smoking status: Never Smoker   . Smokeless tobacco: Not on file  . Alcohol Use: Yes     Comment: rare  . Drug Use: No  . Sexual Activity: Not on file   Other Topics Concern  . Not on file   Social History Narrative  . No narrative on file   No Known Allergies  Results for orders placed during  the hospital encounter of 06/08/12  SURGICAL PCR SCREEN      Result Value Range   MRSA, PCR NEGATIVE  NEGATIVE   Staphylococcus aureus POSITIVE (*) NEGATIVE  CBC      Result Value Range   WBC 7.3  4.0 - 10.5 K/uL   RBC 5.24  4.22 - 5.81 MIL/uL   Hemoglobin 14.6  13.0 - 17.0 g/dL   HCT 40.942.5  81.139.0 - 91.452.0 %   MCV 81.1  78.0 - 100.0 fL   MCH 27.9  26.0 - 34.0 pg   MCHC 34.4  30.0 - 36.0 g/dL   RDW 78.212.8  95.611.5 - 21.315.5 %   Platelets 232  150 - 400 K/uL   Review of Systems  Constitutional: Negative for fever and chills.  Respiratory: Negative for cough and shortness of breath.   Cardiovascular: Negative for chest pain.  Gastrointestinal: Positive for nausea, vomiting, abdominal pain and diarrhea.  Musculoskeletal: Negative for back pain.  Neurological: Positive for dizziness.      Objective:   Physical Exam Nursing note and vitals reviewed. Constitutional: Patient is oriented to person, place, and time. Patient appears well-developed and well-nourished. No distress.  HENT:  Head: Normocephalic and atraumatic.  Neck:  Neck supple. No tracheal deviation present.  Cardiovascular: Normal rate, regular rhythm and normal heart sounds.   No murmur heard. Pulmonary/Chest: Effort normal and breath sounds normal. No respiratory distress. Patient has no wheezes. Patient has no rales.  Musculoskeletal: Normal range of motion.  Neurological: Patient is alert and oriented to person, place, and time.  Skin: Skin is warm and dry.  Psychiatric: Patient has a normal mood and affect. Patient's behavior is normal.  ABD bowel sounds are essentially absent. The patient has significant tenderness in both the right lower quadrant and left lower quadrant. Triage Vitals: BP 112/78  Pulse 113  Temp(Src) 98.9 F (37.2 C) (Oral)  Resp 20  Ht 5\' 7"  (1.702 m)  Wt 200 lb 6 oz (90.89 kg)  BMI 31.38 kg/m2  SpO2 100% Results for orders placed in visit on 06/11/13  POCT CBC      Result Value Range   WBC 12.2  (*) 4.6 - 10.2 K/uL   Lymph, poc 1.0  0.6 - 3.4   POC LYMPH PERCENT 8.1 (*) 10 - 50 %L   MID (cbc) 0.5  0 - 0.9   POC MID % 4.4  0 - 12 %M   POC Granulocyte 10.7 (*) 2 - 6.9   Granulocyte percent 87.5 (*) 37 - 80 %G   RBC 5.94  4.69 - 6.13 M/uL   Hemoglobin 16.5  14.1 - 18.1 g/dL   HCT, POC 16.1  09.6 - 53.7 %   MCV 87.1  80 - 97 fL   MCH, POC 27.8  27 - 31.2 pg   MCHC 31.9  31.8 - 35.4 g/dL   RDW, POC 04.5     Platelet Count, POC 268  142 - 424 K/uL   MPV 8.9  0 - 99.8 fL   DIAGNOSTIC STUDIES: Oxygen Saturation is 100% on room air, normal by my interpretation.    COORDINATION OF CARE:        Assessment & Plan:  Patient declines to go by ambulance . Zofran 8 mg given. CBC was drawn. Patient called his wife and she will transport him to the emergency room for evaluation of Abdominal pain, elevated white count, and low-grade fever. Triage call the [facility]. He will be seen and reevaluated for CT scanning. He was very tender on abdominal exam.

## 2013-06-11 NOTE — ED Notes (Signed)
Bed: [id number] Expected date:  Expected time:  Means of arrival:  Comments: charting

## 2013-06-11 NOTE — ED Notes (Signed)
Patient reports that he went to his Dr and they sent him here for pain to his lower abdominal pain. R/o and needed a CT scan

## 2013-06-11 NOTE — ED Notes (Signed)
Pt given urinal states she can not void at this time

## 2013-06-11 NOTE — ED Provider Notes (Signed)
Medical screening examination/treatment/procedure(s) were performed by non-physician practitioner and as supervising physician I was immediately available for consultation/collaboration.  EKG Interpretation   None         Shanna CiscoMegan E Docherty, MD 06/11/13 2351

## 2013-06-11 NOTE — Discharge Instructions (Signed)
Abdominal Pain, Adult Many things can cause abdominal pain. Usually, abdominal pain is not caused by a disease and will improve without treatment. It can often be observed and treated at home. Your health care provider will do a physical exam and possibly order blood tests and X-rays to help determine the seriousness of your pain. However, in many cases, more time must pass before a clear cause of the pain can be found. Before that point, your health care provider may not know if you need more testing or further treatment. HOME CARE INSTRUCTIONS  Monitor your abdominal pain for any changes. The following actions may help to alleviate any discomfort you are experiencing:  Only take over-the-counter or prescription medicines as directed by your health care provider.  Do not take laxatives unless directed to do so by your health care provider.  Try a clear liquid diet (broth, tea, or water) as directed by your health care provider. Slowly move to a bland diet as tolerated. SEEK MEDICAL CARE IF:  You have unexplained abdominal pain.  You have abdominal pain associated with nausea or diarrhea.  You have pain when you urinate or have a bowel movement.  You experience abdominal pain that wakes you in the night.  You have abdominal pain that is worsened or improved by eating food.  You have abdominal pain that is worsened with eating fatty foods. SEEK IMMEDIATE MEDICAL CARE IF:   Your pain does not go away within 2 hours.  You have a fever.  You keep throwing up (vomiting).  Your pain is felt only in portions of the abdomen, such as the right side or the left lower portion of the abdomen.  You pass bloody or black tarry stools. MAKE SURE YOU:  Understand these instructions.   Will watch your condition.   Will get help right away if you are not doing well or get worse.  Document Released: 01/28/2005 Document Revised: 02/08/2013 Document Reviewed: 12/28/2012 [facility] Patient  Information 2014 [place], [place].  Viral Infections A viral infection can be caused by different types of viruses.Most viral infections are not serious and resolve on their own. However, some infections may cause severe symptoms and may lead to further complications. SYMPTOMS Viruses can frequently cause:  Minor sore throat.  Aches and pains.  Headaches.  Runny nose.  Different types of rashes.  Watery eyes.  Tiredness.  Cough.  Loss of appetite.  Gastrointestinal infections, resulting in nausea, vomiting, and diarrhea. These symptoms do not respond to antibiotics because the infection is not caused by bacteria. However, you might catch a bacterial infection following the viral infection. This is sometimes called a "superinfection." Symptoms of such a bacterial infection may include:  Worsening sore throat with pus and difficulty swallowing.  Swollen neck glands.  Chills and a high or persistent fever.  Severe headache.  Tenderness over the sinuses.  Persistent overall ill feeling (malaise), muscle aches, and tiredness (fatigue).  Persistent cough.  Yellow, green, or brown mucus production with coughing. HOME CARE INSTRUCTIONS   Only take over-the-counter or prescription medicines for pain, discomfort, diarrhea, or fever as directed by your caregiver.  Drink enough water and fluids to keep your urine clear or pale yellow. Sports drinks can provide valuable electrolytes, sugars, and hydration.  Get plenty of rest and maintain proper nutrition. Soups and broths with crackers or rice are fine. SEEK IMMEDIATE MEDICAL CARE IF:   You have severe headaches, shortness of breath, chest pain, neck pain, or an unusual rash.  You have uncontrolled vomiting, diarrhea, or you are unable to keep down fluids.  You or your child has an oral temperature above 102 F (38.9 C), not controlled by medicine.  Your baby is older than 3 months with a rectal temperature of 102 F  (38.9 C) or higher.  Your baby is 513 months old or younger with a rectal temperature of 100.4 F (38 C) or higher. MAKE SURE YOU:   Understand these instructions.  Will watch your condition.  Will get help right away if you are not doing well or get worse. Document Released: 01/28/2005 Document Revised: 07/13/2011 Document Reviewed: 08/25/2010 [facility]Exit[facility] Patient Information 2014 [place]

## 2013-06-11 NOTE — ED Notes (Signed)
Bed: [id number] Expected date:  Expected time:  Means of arrival:  Comments: Please hold.

## 2013-06-11 NOTE — ED Notes (Signed)
He has just vomited, and is c/o recurrence ov low abd. Pain.  Or[place] rec'd. [place]

## 2014-06-16 ENCOUNTER — Emergency Department (HOSPITAL_COMMUNITY)
Admission: EM | Admit: 2014-06-16 | Discharge: 2014-06-16 | Disposition: A | Discharge status: Home / Self Care | Payer: PRIVATE HEALTH INSURANCE | Attending: Emergency Medicine | Admitting: Emergency Medicine

## 2014-06-16 ENCOUNTER — Encounter (HOSPITAL_COMMUNITY): Payer: Self-pay

## 2014-06-16 ENCOUNTER — Emergency Department (HOSPITAL_COMMUNITY): Payer: PRIVATE HEALTH INSURANCE

## 2014-06-16 DIAGNOSIS — R109 Unspecified abdominal pain: Secondary | ICD-10-CM

## 2014-06-16 DIAGNOSIS — N201 Calculus of ureter: Secondary | ICD-10-CM | POA: Diagnosis not present

## 2014-06-16 DIAGNOSIS — Z79899 Other long term (current) drug therapy: Secondary | ICD-10-CM | POA: Diagnosis not present

## 2014-06-16 MED ORDER — HYDROMORPHONE HCL 1 MG/ML IJ SOLN
1.0000 mg | Freq: Once | INTRAMUSCULAR | Status: AC
  Administered 2014-06-16: 1 mg via INTRAVENOUS
  Filled 2014-06-16: qty 1

## 2014-06-16 MED ORDER — KETOROLAC TROMETHAMINE 10 MG PO TABS: 10.0000 mg | ORAL_TABLET | Freq: Four times a day (QID) | ORAL | Status: DC

## 2014-06-16 MED ORDER — ONDANSETRON HCL 4 MG/2ML IJ SOLN
4.0000 mg | Freq: Once | INTRAMUSCULAR | Status: AC
  Administered 2014-06-16: 4 mg via INTRAVENOUS
  Filled 2014-06-16: qty 2

## 2014-06-16 MED ORDER — OXYCODONE-ACETAMINOPHEN 5-325 MG PO TABS: 1.0000 | ORAL_TABLET | ORAL | Status: DC | PRN

## 2014-06-16 MED ORDER — OXYCODONE-ACETAMINOPHEN 5-325 MG PO TABS: 1.0000 | ORAL_TABLET | Freq: Four times a day (QID) | ORAL | Status: DC | PRN

## 2014-06-16 MED ORDER — KETOROLAC TROMETHAMINE 30 MG/ML IJ SOLN
30.0000 mg | Freq: Once | INTRAMUSCULAR | Status: AC
  Administered 2014-06-16: 30 mg via INTRAVENOUS
  Filled 2014-06-16: qty 1

## 2014-06-16 MED ORDER — TAMSULOSIN HCL 0.4 MG PO CAPS
0.4000 mg | ORAL_CAPSULE | Freq: Once | ORAL | Status: AC
  Administered 2014-06-16: 0.4 mg via ORAL
  Filled 2014-06-16: qty 1

## 2014-06-16 MED ORDER — TAMSULOSIN HCL 0.4 MG PO CAPS: ORAL_CAPSULE | ORAL | Status: DC

## 2014-06-16 MED ORDER — ONDANSETRON 4 MG PO TBDP: 4.0000 mg | ORAL_TABLET | Freq: Three times a day (TID) | ORAL | Status: DC | PRN

## 2014-06-16 MED ORDER — SODIUM CHLORIDE 0.9 % IV SOLN
INTRAVENOUS | Status: DC
  Administered 2014-06-16: 03:00:00 via INTRAVENOUS

## 2014-06-16 NOTE — ED Notes (Signed)
Pt states he awoke approx 30 mins ago with severe pain to right flank with vomiting

## 2014-06-16 NOTE — ED Provider Notes (Signed)
CSN: [id number]     Arrival date & time 06/16/14  4098 History   First MD Initiated Contact with Patient 06/16/14 [phone]     Chief Complaint  Patient presents with  . Flank Pain     (Consider location/radiation/quality/duration/timing/severity/associated sxs/prior Treatment) HPI Patient reports he was awakened acutely from sleep at 2 AM with right-sided flank pain that radiates into his right abdomen and groin. He has had nausea and vomiting without diarrhea. The pain is constant. He states nothing he does makes it feel better, nothing he does makes it feel worse. He has been unable to sit still because of discomfort. He reports all day today he was having frequency and urinating small amounts. He denies having hematuria or darkening of his urine. He states he's never had this before. He reports his mother has a history of kidney stones. He does admit to drinking milk and moderate caffeine intake.    History reviewed. No pertinent past medical history. Past Surgical History  Procedure Laterality Date  . Hernia repair      x 2 hernia  . Umbilical hernia repair  06/08/2012    Procedure: HERNIA REPAIR UMBILICAL ADULT;  Surgeon: Shelly Rubenstein, MD;  Location: [facility] OR;  Service: General;  Laterality: N/A;  . Insertion of mesh  06/08/2012    Procedure: INSERTION OF MESH;  Surgeon: Shelly Rubenstein, MD;  Location: [facility] OR;  Service: General;  Laterality: N/A;   Family History  Problem Relation Age of Onset  . Cancer Maternal Grandmother     lung  . Cancer Maternal Grandfather     gastric   History  Substance Use Topics  . Smoking status: Never Smoker   . Smokeless tobacco: Not on file  . Alcohol Use: Yes     Comment: rare  lives with spouse  Review of Systems  All other systems reviewed and are negative.     Allergies  Bee venom  Home Medications   Prior to Admission medications   Medication Sig Start Date End Date Taking? Authorizing Provider  HYDROcodone-acetaminophen  (NORCO/VICODIN) 5-325 MG per tablet Take 1-2 tablets by mouth every 6 (six) hours as needed. 06/11/13   Mora Bellman, PA-C  meclizine (ANTIVERT) 25 MG tablet Take 25 mg by mouth 2 (two) times daily.    Historical Provider, MD  ondansetron (ZOFRAN) 4 MG tablet Take 1 tablet (4 mg total) by mouth every 6 (six) hours. 06/11/13   Mora Bellman, PA-C  ondansetron (ZOFRAN) 8 MG tablet Take 16 mg by mouth every 8 (eight) hours as needed for nausea or vomiting.    Historical Provider, MD   BP 145/97 mmHg  Pulse 64  Temp(Src) 97.8 F (36.6 C) (Oral)  Resp 22  Ht 5\' 8"  (1.727 m)  Wt 195 lb (88.451 kg)  BMI 29.66 kg/m2  SpO2 100%  Vital signs normal   Physical Exam  Constitutional: He is oriented to person, place, and time. He appears well-developed and well-nourished. He appears listless.  Non-toxic appearance. He does not appear ill. He appears distressed.  Has trouble sitting still  HENT:  Head: Normocephalic and atraumatic.  Right Ear: External ear normal.  Left Ear: External ear normal.  Nose: Nose normal. No mucosal edema or rhinorrhea.  Mouth/Throat: Oropharynx is clear and moist and mucous membranes are normal. No dental abscesses or uvula swelling.  Eyes: Conjunctivae and EOM are normal. Pupils are equal, round, and reactive to light.  Neck: Normal range of motion and full  passive range of motion without pain. Neck supple.  Cardiovascular: Normal rate, regular rhythm and normal heart sounds.  Exam reveals no gallop and no friction rub.   No murmur heard. Pulmonary/Chest: Effort normal and breath sounds normal. No respiratory distress. He has no wheezes. He has no rhonchi. He has no rales. He exhibits no tenderness and no crepitus.  Abdominal: Soft. Normal appearance and bowel sounds are normal. He exhibits no distension. There is no tenderness. There is no rebound and no guarding.  Musculoskeletal: Normal range of motion. He exhibits no edema or tenderness.  Moves all extremities  well.   Neurological: He is oriented to person, place, and time. He has normal strength. He appears listless. No cranial nerve deficit.  Skin: Skin is warm, dry and intact. No rash noted. No erythema. No pallor.  Psychiatric: He has a normal mood and affect. His speech is normal and behavior is normal. His mood appears not anxious.  Nursing note and vitals reviewed.   ED Course  Procedures (including critical care time)  Medications  0.9 %  sodium chloride infusion ( Intravenous New Bag/Given 06/16/14 0258)  tamsulosin (FLOMAX) capsule 0.4 mg (not administered)  HYDROmorphone (DILAUDID) injection 1 mg (1 mg Intravenous Given 06/16/14 0302)  ondansetron (ZOFRAN) injection 4 mg (4 mg Intravenous Given 06/16/14 0259)  HYDROmorphone (DILAUDID) injection 1 mg (1 mg Intravenous Given 06/16/14 0336)  ketorolac (TORADOL) 30 MG/ML injection 30 mg (30 mg Intravenous Given 06/16/14 0421)   IV was inserted and patient was given IV nausea and pain medicine. CT scan was ordered for presumed renal stone or ureterolithiasis.  03:20 Pt still has pain but states the edge is gone. More pain meds ordered. Just returned for radiology, unable to look at his scan yet.   04:05 pt given his CT results. He was given toradol to help him pass the stone. Pt states his Mother was never able to pass any of her renal stones.   05:00 he relates the toradol helped his pain a lot. His pain is a "1" and gets rare sharp jabs.  Labs Review Results for orders placed or performed during the hospital encounter of 06/11/13  CBC with Differential  Result Value Ref Range   WBC 12.3 (H) 4.0 - 10.5 K/uL   RBC 5.82 (H) 4.22 - 5.81 MIL/uL   Hemoglobin 16.2 13.0 - 17.0 g/dL   HCT 40.9 81.1 - 91.4 %   MCV 81.1 78.0 - 100.0 fL   MCH 27.8 26.0 - 34.0 pg   MCHC 34.3 30.0 - 36.0 g/dL   RDW 78.2 95.6 - 21.3 %   Platelets 234 150 - 400 K/uL   Neutrophils Relative % 90 (H) 43 - 77 %   Neutro Abs 11.2 (H) 1.7 - 7.7 K/uL   Lymphocytes  Relative 4 (L) 12 - 46 %   Lymphs Abs 0.5 (L) 0.7 - 4.0 K/uL   Monocytes Relative 5 3 - 12 %   Monocytes Absolute 0.6 0.1 - 1.0 K/uL   Eosinophils Relative 0 0 - 5 %   Eosinophils Absolute 0.0 0.0 - 0.7 K/uL   Basophils Relative 0 0 - 1 %   Basophils Absolute 0.0 0.0 - 0.1 K/uL  Comprehensive metabolic panel  Result Value Ref Range   Sodium 138 137 - 147 mEq/L   Potassium 4.0 3.7 - 5.3 mEq/L   Chloride 98 96 - 112 mEq/L   CO2 24 19 - 32 mEq/L   Glucose, Bld 94 70 - 99  mg/dL   BUN 18 6 - 23 mg/dL   Creatinine, Ser 1.911.09 0.50 - 1.35 mg/dL   Calcium 9.8 8.4 - 47.810.5 mg/dL   Total Protein 8.8 (H) 6.0 - 8.3 g/dL   Albumin 4.9 3.5 - 5.2 g/dL   AST 21 0 - 37 U/L   ALT 30 0 - 53 U/L   Alkaline Phosphatase 86 39 - 117 U/L   Total Bilirubin 0.6 0.3 - 1.2 mg/dL   GFR calc non Af Amer >90 >90 mL/min   GFR calc Af Amer >90 >90 mL/min  Lipase, blood  Result Value Ref Range   Lipase 21 11 - 59 U/L  Urinalysis, Routine w reflex microscopic  Result Value Ref Range   Color, Urine YELLOW YELLOW   APPearance CLEAR CLEAR   Specific Gravity, Urine >1.046 (H) 1.005 - 1.030   pH 7.0 5.0 - 8.0   Glucose, UA NEGATIVE NEGATIVE mg/dL   Hgb urine dipstick NEGATIVE NEGATIVE   Bilirubin Urine NEGATIVE NEGATIVE   Ketones, ur NEGATIVE NEGATIVE mg/dL   Protein, ur NEGATIVE NEGATIVE mg/dL   Urobilinogen, UA 0.2 0.0 - 1.0 mg/dL   Nitrite NEGATIVE NEGATIVE   Leukocytes, UA NEGATIVE NEGATIVE   Laboratory interpretation all normal except concentrated urine, leukocytosis     Imaging Review Ct Renal Stone Study  06/16/2014   CLINICAL DATA:  Severe RIGHT flank pain for 2 hours, difficulty urinating for 1 day.  EXAM: CT ABDOMEN AND PELVIS WITHOUT CONTRAST  TECHNIQUE: Multidetector CT imaging of the abdomen and pelvis was performed following the standard protocol without IV contrast.  COMPARISON:  CT of the abdomen and pelvis June 11, 2013  FINDINGS: LUNG BASES: Included view of the lung bases demonstrate 3  mm RIGHT lower lobe sub solid pulmonary nodule, below size surveillance. The visualized heart and pericardium are unremarkable.  KIDNEYS/BLADDER: Kidneys are orthotopic, demonstrating normal size and morphology. 2 mm LEFT interpolar nephrolithiasis. No hydronephrosis; limited assessment for renal masses on this nonenhanced examination. The unopacified ureters are normal in course and caliber. 2 mm distal RIGHT ureteric calculus. Urinary bladder is partially distended and unremarkable.  SOLID ORGANS: The liver, spleen, gallbladder, pancreas and adrenal glands are unremarkable for this non-contrast examination.  GASTROINTESTINAL TRACT: The stomach, small and large bowel are normal in course and caliber without inflammatory changes, the sensitivity may be decreased by lack of enteric contrast. Normal appendix.  PERITONEUM/RETROPERITONEUM: Aortoiliac vessels are normal in course and caliber. No lymphadenopathy by CT size criteria. Internal reproductive organs are unremarkable. No intraperitoneal free fluid nor free air.  SOFT TISSUES/ OSSEOUS STRUCTURES:  Nonsuspicious.  IMPRESSION: Nonobstructing 2 mm distal RIGHT ureteral calculus. 2 mm nonobstructing LEFT interpolar calculus.  Normal appendix.   Electronically Signed   By: Awilda Metroourtnay  Bloomer   On: 06/16/2014 03:43     EKG Interpretation None      MDM   Final diagnoses:  Acute right flank pain  Right ureteral stone    New Prescriptions   KETOROLAC (TORADOL) 10 MG TABLET    Take 1 tablet (10 mg total) by mouth every 6 (six) hours.   ONDANSETRON (ZOFRAN ODT) 4 MG DISINTEGRATING TABLET    Take 1 tablet (4 mg total) by mouth every 8 (eight) hours as needed for nausea or vomiting.   OXYCODONE-ACETAMINOPHEN (PERCOCET/ROXICET) 5-325 MG PER TABLET    Take 1-2 tablets by mouth every 6 (six) hours as needed for severe pain.   OXYCODONE-ACETAMINOPHEN (PERCOCET/ROXICET) 5-325 MG PER TABLET    Take 1-2 tablets  by mouth every 4 (four) hours as needed for severe  pain.   TAMSULOSIN (FLOMAX) 0.4 MG CAPS CAPSULE    Take 1 po QD until you pass the stone.    Plan discharge  Devoria Albe, MD, Franz Dell, MD 06/16/14 [phone]

## 2014-06-16 NOTE — Discharge Instructions (Signed)
Try to drink more fluids. Your urine sample showed you are very dehydrated. Take the medications as prescribed. If you get worse such as get a fever, or have uncontrolled vomiting or pain, go to [facility] to be evaluated.     Kidney Stones Kidney stones (urolithiasis) are deposits that form inside your kidneys. The intense pain is caused by the stone moving through the urinary tract. When the stone moves, the ureter goes into spasm around the stone. The stone is usually passed in the urine.  CAUSES   A disorder that makes certain neck glands produce too much parathyroid hormone (primary hyperparathyroidism).  A buildup of uric acid crystals, similar to gout in your joints.  Narrowing (stricture) of the ureter.  A kidney obstruction present at birth (congenital obstruction).  Previous surgery on the kidney or ureters.  Numerous kidney infections. SYMPTOMS   Feeling sick to your stomach (nauseous).  Throwing up (vomiting).  Blood in the urine (hematuria).  Pain that usually spreads (radiates) to the groin.  Frequency or urgency of urination. DIAGNOSIS   Taking a history and physical exam.  Blood or urine tests.  CT scan.  Occasionally, an examination of the inside of the urinary bladder (cystoscopy) is performed. TREATMENT   Observation.  Increasing your fluid intake.  Extracorporeal shock wave lithotripsy--This is a noninvasive procedure that uses shock waves to break up kidney stones.  Surgery may be needed if you have severe pain or persistent obstruction. There are various surgical procedures. Most of the procedures are performed with the use of small instruments. Only small incisions are needed to accommodate these instruments, so recovery time is minimized. The size, location, and chemical composition are all important variables that will determine the proper choice of action for you. Talk to your health care provider to better understand your situation  so that you will minimize the risk of injury to yourself and your kidney.  HOME CARE INSTRUCTIONS   Drink enough water and fluids to keep your urine clear or pale yellow. This will help you to pass the stone or stone fragments.  Strain all urine through the provided strainer. Keep all particulate matter and stones for your health care provider to see. The stone causing the pain may be as small as a grain of salt. It is very important to use the strainer each and every time you pass your urine. The collection of your stone will allow your health care provider to analyze it and verify that a stone has actually passed. The stone analysis will often identify what you can do to reduce the incidence of recurrences.  Only take over-the-counter or prescription medicines for pain, discomfort, or fever as directed by your health care provider.  Make a follow-up appointment with your health care provider as directed.  Get follow-up X-rays if required. The absence of pain does not always mean that the stone has passed. It may have only stopped moving. If the urine remains completely obstructed, it can cause loss of kidney function or even complete destruction of the kidney. It is your responsibility to make sure X-rays and follow-ups are completed. Ultrasounds of the kidney can show blockages and the status of the kidney. Ultrasounds are not associated with any radiation and can be performed easily in a matter of minutes. SEEK MEDICAL CARE IF:  You experience pain that is progressive and unresponsive to any pain medicine you have been prescribed. SEEK IMMEDIATE MEDICAL CARE IF:   Pain cannot be controlled with  the prescribed medicine.  You have a fever or shaking chills.  The severity or intensity of pain increases over 18 hours and is not relieved by pain medicine.  You develop a new onset of abdominal pain.  You feel faint or pass out.  You are unable to urinate. MAKE SURE YOU:   Understand these  instructions.  Will watch your condition.  Will get help right away if you are not doing well or get worse. Document Released: 04/20/2005 Document Revised: 12/21/2012 Document Reviewed: 09/21/2012 [facility] Patient Information 2015 [place], [place]. This information is not intended to replace advice given to you by your health care provider. Make sure you discuss any questions you have with your health care provider.  Ureteral Colic (Kidney Stones) Ureteral colic is the result of a condition when kidney stones form inside the kidney. Once kidney stones are formed they may move into the tube that connects the kidney with the bladder (ureter). If this occurs, this condition may cause pain (colic) in the ureter.  CAUSES  Pain is caused by stone movement in the ureter and the obstruction caused by the stone. SYMPTOMS  The pain comes and goes as the ureter contracts around the stone. The pain is usually intense, sharp, and stabbing in character. The location of the pain may move as the stone moves through the ureter. When the stone is near the kidney the pain is usually located in the back and radiates to the belly (abdomen). When the stone is ready to pass into the bladder the pain is often located in the lower abdomen on the side the stone is located. At this location, the symptoms may mimic those of a urinary tract infection with urinary frequency. Once the stone is located here it often passes into the bladder and the pain disappears completely. TREATMENT   Your caregiver will provide you with medicine for pain relief.  You may require specialized follow-up X-rays.  The absence of pain does not always mean that the stone has passed. It may have just stopped moving. If the urine remains completely obstructed, it can cause loss of kidney function or even complete destruction of the involved kidney. It is your responsibility and in your interest that X-rays and follow-ups as suggested by your caregiver  are completed. Relief of pain without passage of the stone can be associated with severe damage to the kidney, including loss of kidney function on that side.  If your stone does not pass on its own, additional measures may be taken by your caregiver to ensure its removal. HOME CARE INSTRUCTIONS   Increase your fluid intake. Water is the preferred fluid since juices containing vitamin C may acidify the urine making it less likely for certain stones (uric acid stones) to pass.  Strain all urine. A strainer will be provided. Keep all particulate matter or stones for your caregiver to inspect.  Take your pain medicine as directed.  Make a follow-up appointment with your caregiver as directed.  Remember that the goal is passage of your stone. The absence of pain does not mean the stone is gone. Follow your caregiver's instructions.  Only take over-the-counter or prescription medicines for pain, discomfort, or fever as directed by your caregiver. SEEK MEDICAL CARE IF:   Pain cannot be controlled with the prescribed medicine.  You have a fever.  Pain continues for longer than your caregiver advises it should.  There is a change in the pain, and you develop chest discomfort or constant abdominal  pain.  You feel faint or pass out. MAKE SURE YOU:   Understand these instructions.  Will watch your condition.  Will get help right away if you are not doing well or get worse. Document Released: 01/28/2005 Document Revised: 08/15/2012 Document Reviewed: 10/15/2010 [facility] Patient Information 2015 [place], [place]. This information is not intended to replace advice given to you by your health care provider. Make sure you discuss any questions you have with your health care provider.

## 2014-06-18 MED FILL — Oxycodone w/ Acetaminophen Tab 5-325 MG: ORAL | Qty: 6 | Status: AC

## 2015-04-08 DIAGNOSIS — S83004A Unspecified dislocation of right patella, initial encounter: Secondary | ICD-10-CM | POA: Insufficient documentation

## 2015-04-08 HISTORY — DX: Unspecified dislocation of right patella, initial encounter: S83.004A

## 2015-04-11 ENCOUNTER — Other Ambulatory Visit: Payer: Self-pay | Admitting: Orthopaedic Surgery

## 2015-04-11 DIAGNOSIS — M25561 Pain in right knee: Secondary | ICD-10-CM

## 2015-04-11 DIAGNOSIS — Z139 Encounter for screening, unspecified: Secondary | ICD-10-CM

## 2015-04-12 ENCOUNTER — Ambulatory Visit
Admission: RE | Admit: 2015-04-12 | Discharge: 2015-04-12 | Disposition: A | Discharge status: Home / Self Care | Payer: Commercial Managed Care - PPO | Source: Ambulatory Visit | Attending: Orthopaedic Surgery | Admitting: Orthopaedic Surgery

## 2015-04-12 DIAGNOSIS — Z139 Encounter for screening, unspecified: Secondary | ICD-10-CM

## 2015-04-13 ENCOUNTER — Ambulatory Visit
Admission: RE | Admit: 2015-04-13 | Discharge: 2015-04-13 | Disposition: A | Discharge status: Home / Self Care | Payer: Commercial Managed Care - PPO | Source: Ambulatory Visit | Attending: Orthopaedic Surgery | Admitting: Orthopaedic Surgery

## 2015-04-13 DIAGNOSIS — M25561 Pain in right knee: Secondary | ICD-10-CM

## 2015-05-20 ENCOUNTER — Ambulatory Visit: Payer: Commercial Managed Care - PPO | Attending: Orthopaedic Surgery | Admitting: Physical Therapy

## 2015-05-20 DIAGNOSIS — R29898 Other symptoms and signs involving the musculoskeletal system: Secondary | ICD-10-CM | POA: Insufficient documentation

## 2015-05-20 DIAGNOSIS — M25561 Pain in right knee: Secondary | ICD-10-CM | POA: Diagnosis not present

## 2015-05-20 DIAGNOSIS — M25661 Stiffness of right knee, not elsewhere classified: Secondary | ICD-10-CM

## 2015-05-20 DIAGNOSIS — M25461 Effusion, right knee: Secondary | ICD-10-CM | POA: Diagnosis present

## 2015-05-20 NOTE — Therapy (Signed)
[facility] [address] [place], [place], [address] Phone: [phone]   Fax:  [phone]  Physical Therapy Evaluation  Patient Details  Name: Ronnie Baker MRN: [id number] Date of Birth: 07-28-1985 Referring Provider: Doneen Poisson MD  Encounter Date: 05/20/2015      PT End of Session - 05/20/15 1829    Visit Number 1   Number of Visits 2   Date for PT Re-Evaluation 07/08/15   PT Start Time 0230   PT Stop Time 0314   PT Time Calculation (min) 44 min      No past medical history on file.  Past Surgical History  Procedure Laterality Date  . Hernia repair      x 2 hernia  . Umbilical hernia repair  06/08/2012    Procedure: HERNIA REPAIR UMBILICAL ADULT;  Surgeon: Shelly Rubenstein, MD;  Location[facility] OR;  Service: General;  Laterality: N/A;  . Insertion of mesh  06/08/2012    Procedure: INSERTION OF MESH;  Surgeon: Shelly Rubenstein, MD;  Location: [facility] OR;  Service: General;  Laterality: N/A;    There were no vitals filed for this visit.  Visit Diagnosis:  Right knee pain - Plan: PT plan of care cert/re-cert  Decreased range of motion of right knee - Plan: PT plan of care cert/re-cert  Knee swelling, right - Plan: PT plan of care cert/re-cert      Subjective Assessment - 05/20/15 1832    Subjective I want to get back to work.   Limitations Walking   Patient Stated Goals Want to get back to work.   Currently in Pain? Yes   Pain Score 2    Pain Location Knee   Pain Orientation Right   Pain Descriptors / Indicators Aching   Pain Type Acute pain   Pain Onset More than a month ago   Pain Frequency Constant   Aggravating Factors  Walking.   Pain Relieving Factors Rest.            [facility] PT Assessment - 05/20/15 0001    Assessment   Medical Diagnosis Right knee patellar dislocation.   Referring Provider Doneen Poisson MD   Onset Date/Surgical Date --  04/06/15 (date of injury).   Precautions   Required Braces or Orthoses Other Brace/Splint   Other Brace/Splint Right knee brace with patellar orifice.   Restrictions   Weight Bearing Restrictions No   Balance Screen   Has the patient fallen in the past 6 months No   Has the patient had a decrease in activity level because of a fear of falling?  No   Is the patient reluctant to leave their home because of a fear of falling?  No   Home [employer]ist information centre manager residence   Prior Function   Level of Independence Independent   Observation/Other Assessments-Edema    Edema Circumferential   Circumferential Edema   Circumferential - Right 43 cms   Circumferential - Left  40 cms   Posture/Postural Control   Posture/Postural Control Postural limitations   Postural Limitations --  Genu recurvatum.   ROM / Strength   AROM / PROM / Strength AROM;Strength   AROM   Overall AROM Comments Right knee active range of motion 0 to 90 degrees.   Strength   Overall Strength Comments Notable right VMP atrophy and volitional contraction when contralaterally compared.   Palpation   Palpation comment Patient's CC is the area of the right popliteal fossa though  no significant palpable tenderness today.   Special Tests    Special Tests --  Generally good right knee stability.   Ambulation/Gait   Gait Comments Gait remarkable for a minimal decrease in knee flexion during swing through phase of gait cycle due to brace.                   [facility] Adult PT Treatment/Exercise - 05/20/15 0001    Exercises   Exercises Knee/Hip   Knee/Hip Exercises: Supine   Short Arc Quad Sets Limitations SAQ's non-resisted  facilitated with VMS to patient's right VMO x 15 minutes with 10 sec extension holds and 10 rests.                     PT Long Term Goals - 05/20/15 1842    PT LONG TERM GOAL #1   Title Ind with a HEP.   Time 6   Period Weeks   Status New   PT LONG TERM GOAL #2   Title Increase right knee flexion to 120  degrees to allow for greater ease of accomplishing functional tasks.   Time 6   Period Weeks   Status New   PT LONG TERM GOAL #3   Title Perform ADL's with pain not > 3/10.               Plan - 05/20/15 1436    Clinical Impression Statement The patient was walking on 04/06/15 and his right patella dislocated.  MRI revealed a torn right MPFL.  He is wearing a right kne brace with a patellar orifice to medialize his patella.  His pain is a 5-6/10 today.  He desires to get back to work asap.   Pt will benefit from skilled therapeutic intervention in order to improve on the following deficits Pain;Decreased range of motion;Decreased activity tolerance;Increased edema   Rehab Potential Excellent   PT Frequency 2x / week   PT Duration 6 weeks   PT Treatment/Interventions Electrical Stimulation;Therapeutic exercise;Therapeutic activities;Ultrasound;Neuromuscular re-education;Patient/family education;Manual techniques;Vasopneumatic Device   PT Next Visit Plan VMS to right VMO.  SDLY hip abduction and right "clamshell" exercise.  Focus on VMO strengthening and pain-free quadriceps strengthening.   Consulted and Agree with Plan of Care Patient         Problem List Patient Active Problem List   Diagnosis Date Noted  . Umbilical hernia 05/31/2012    Kotaro Buer, [place] MPT 05/20/2015, 6:56 PM  [facility] [address] [place], [place], [address] Phone: [phone]   Fax:  [phone]  Name: Ronnie CrockerJoshua T Hufford MRN: [id number] Date of Birth: August 26, 1985

## 2015-05-23 ENCOUNTER — Ambulatory Visit: Payer: Commercial Managed Care - PPO | Admitting: Physical Therapy

## 2015-05-23 DIAGNOSIS — M25661 Stiffness of right knee, not elsewhere classified: Secondary | ICD-10-CM

## 2015-05-23 DIAGNOSIS — M25561 Pain in right knee: Secondary | ICD-10-CM

## 2015-05-23 NOTE — Therapy (Signed)
[facility] [address] [place], [place], [address] Phone: [phone]   Fax:  [phone]  Physical Therapy Treatment  Patient Details  Name: Ronnie Baker MRN: [id number] Date of Birth: 1985-09-18 Referring Provider: Doneen Poisson MD  Encounter Date: 05/23/2015      PT End of Session - 05/23/15 0946    Visit Number 2   Number of Visits 12   Date for PT Re-Evaluation 07/08/15   PT Start Time 0947   PT Stop Time 1042   PT Time Calculation (min) 55 min   Activity Tolerance Patient tolerated treatment well   Behavior During Therapy [facility] for tasks assessed/performed      No past medical history on file.  Past Surgical History  Procedure Laterality Date  . Hernia repair      x 2 hernia  . Umbilical hernia repair  06/08/2012    Procedure: HERNIA REPAIR UMBILICAL ADULT;  Surgeon: Shelly Rubenstein, MD;  Location: [facility] OR;  Service: General;  Laterality: N/A;  . Insertion of mesh  06/08/2012    Procedure: INSERTION OF MESH;  Surgeon: Shelly Rubenstein, MD;  Location: [facility] OR;  Service: General;  Laterality: N/A;    There were no vitals filed for this visit.  Visit Diagnosis:  Right knee pain  Decreased range of motion of right knee      Subjective Assessment - 05/23/15 0947    Subjective I want to get back to work.   Limitations Walking   Patient Stated Goals Want to get back to work.   Currently in Pain? No/denies                         [facility] Adult PT Treatment/Exercise - 05/23/15 0001    Knee/Hip Exercises: Aerobic   Nustep L 6 x 3 min, L 7 x 7 min   Knee/Hip Exercises: Supine   Quad Sets 10 reps  5 sec hold; then 1 x 15 set with ball squeeze   Short Arc Quad Sets Strengthening;Right;15 reps  5 sec   Straight Leg Raises Right;15 reps   Straight Leg Raise with External Rotation Right;15 reps   Knee/Hip Exercises: Sidelying   Hip ADduction Right;15 reps   Modalities   Modalities Copy Location R VMO/quad; Russian 1 channel; 10/10 x 15 min to tolerance   Electrical Stimulation Parameters with knee at 60 degrees over white bolster and weight on ankle   Electrical Stimulation Goals Strength;Neuromuscular facilitation                PT Education - 05/23/15 1255    Education provided Yes   Education Details HEP   Person(s) Educated Patient   Methods Explanation;Demonstration;Handout   Comprehension Verbalized understanding;Returned demonstration             PT Long Term Goals - 05/20/15 1842    PT LONG TERM GOAL #1   Title Ind with a HEP.   Time 6   Period Weeks   Status New   PT LONG TERM GOAL #2   Title Increase right knee flexion to 120 degrees to allow for greater ease of accomplishing functional tasks.   Time 6   Period Weeks   Status New   PT LONG TERM GOAL #3   Title Perform ADL's with pain not > 3/10.  Plan - 05/23/15 1046    Clinical Impression Statement Patient tolerated therex well today without increased pain in R knee.   PT Next Visit Plan Finalize HEP, provide resistive band. Try functional squats, Single leg standing activiites, [place] VMS as previous   PT Home Exercise Plan QS with add squeeze, SLR, SLR with ER, SDLY hip add   Consulted and Agree with Plan of Care Patient        Problem List Patient Active Problem List   Diagnosis Date Noted  . Umbilical hernia 05/31/2012    Solon Palm PT  05/23/2015, 12:58 PM  [facility] [address] [place], [place], [address] Phone: [phone]   Fax:  [phone]  Name: Ronnie Baker MRN: [id number] Date of Birth: [birth year]

## 2015-05-24 ENCOUNTER — Ambulatory Visit: Payer: Commercial Managed Care - PPO | Admitting: *Deleted

## 2015-05-24 DIAGNOSIS — M25461 Effusion, right knee: Secondary | ICD-10-CM

## 2015-05-24 DIAGNOSIS — M25561 Pain in right knee: Secondary | ICD-10-CM

## 2015-05-24 DIAGNOSIS — M25661 Stiffness of right knee, not elsewhere classified: Secondary | ICD-10-CM

## 2015-05-24 NOTE — Therapy (Addendum)
[facility] [place], [place], [address] Phone: [phone]   Fax:  [phone]  Physical Therapy Treatment  Patient Details  Name: Ronnie Baker MRN: [id number] Date of Birth: 02-05-1986 Referring Provider: Jean Rosenthal MD  Encounter Date: 05/24/2015      PT End of Session - 05/24/15 1213    Visit Number 3   Number of Visits 12   Date for PT Re-Evaluation 07/08/15   PT Start Time 1115   PT Stop Time 1213   PT Time Calculation (min) 58 min      No past medical history on file.  Past Surgical History  Procedure Laterality Date  . Hernia repair      x 2 hernia  . Umbilical hernia repair  06/08/2012    Procedure: HERNIA REPAIR UMBILICAL ADULT;  Surgeon: Harl Bowie, MD;  Location: [place];  Service: General;  Laterality: N/A;  . Insertion of mesh  06/08/2012    Procedure: INSERTION OF MESH;  Surgeon: Harl Bowie, MD;  Location: [place];  Service: General;  Laterality: N/A;    There were no vitals filed for this visit.  Visit Diagnosis:  Right knee pain  Decreased range of motion of right knee  Knee swelling, right      Subjective Assessment - 05/24/15 1121    Subjective I want to get back to work.  Going back to work on Tuesday   Limitations Walking   Patient Stated Goals Want to get back to work.   Currently in Pain? Yes   Pain Score 2    Pain Location Knee   Pain Orientation Right   Pain Descriptors / Indicators Aching   Pain Type Acute pain   Pain Onset More than a month ago   Pain Frequency Constant   Aggravating Factors  walking   Pain Relieving Factors Rest                         [facility] Adult PT Treatment/Exercise - 05/24/15 0001    Exercises   Exercises Knee/Hip   Knee/Hip Exercises: Aerobic   Nustep L 7x 10 min, L 8 x 10 min   Knee/Hip Exercises: Standing   SLS balance on RT LE   Knee/Hip Exercises: Seated   Sit to Sand 2 sets;10 reps   Knee/Hip  Exercises: Supine   Straight Leg Raises Right;15 reps   Straight Leg Raise with External Rotation Right;15 reps   Knee/Hip Exercises: Sidelying   Hip ADduction Right;15 reps   Modalities   Modalities Electrical Stimulation   Electrical Stimulation   Electrical Stimulation Location R VMO/quad; Russian 1 channel; 10/10 x 15 min to tolerance with 2# wt   Electrical Stimulation Goals Strength;Neuromuscular facilitation                PT Education - 05/23/15 1255    Education provided Yes   Education Details HEP   Person(s) Educated Patient   Methods Explanation;Demonstration;Handout   Comprehension Verbalized understanding;Returned demonstration             PT Long Term Goals - 05/24/15 1216    PT LONG TERM GOAL #1   Title Ind with a HEP.   Time 6   Period Weeks   Status Achieved   PT LONG TERM GOAL #2   Title Increase right knee flexion to 120 degrees to allow for greater ease of accomplishing functional tasks.   Time 6  Period Weeks   Status On-going   PT LONG TERM GOAL #3   Title Perform ADL's with pain not > 3/10.   Period Weeks   Status On-going               Plan - 05/24/15 1136    Clinical Impression Statement Pt did great with Rx for RT knee. Added SLS and sit to stand for funcional squating to HEP. LTG for HEP was Met, but not others.   Pt will benefit from skilled therapeutic intervention in order to improve on the following deficits Pain;Decreased range of motion;Decreased activity tolerance;Increased edema   Rehab Potential Excellent   PT Frequency 2x / week   PT Duration 6 weeks   PT Treatment/Interventions Electrical Stimulation;Therapeutic exercise;Therapeutic activities;Ultrasound;Neuromuscular re-education;Patient/family education;Manual techniques;Vasopneumatic Device   PT Next Visit Plan Finalize HEP, provide resistive band. Try functional squats, Single leg standing activiites, [place] VMS as previous        OOT leaving Tuesday to go  BTW and then be back for MD F/U. ON HOLD x 2 wks   PT Home Exercise Plan QS with add squeeze, SLR, SLR with ER, SDLY hip add   Consulted and Agree with Plan of Care Patient        Problem List Patient Active Problem List   Diagnosis Date Noted  . Umbilical hernia 16/42/9037    Mozetta Murfin,CHRIS, PTA 05/24/2015, 12:30 PM  [facility] [place], [place], [address] Phone: [phone]   Fax:  [phone]  Name: Ronnie Baker MRN: [id number] Date of Birth: 1986/03/22   PHYSICAL THERAPY DISCHARGE SUMMARY  Visits from Start of Care: 3.  Current functional level related to goals / functional outcomes: See above.   Remaining deficits: See below.   Education / Equipment: HEP. Plan: Patient agrees to discharge.  Patient goals were partially met. Patient is being discharged due to not returning since the last visit.  ?????        Mali Applegate MPT

## 2015-05-27 ENCOUNTER — Encounter: Payer: Commercial Managed Care - PPO | Admitting: Physical Therapy

## 2017-05-30 IMAGING — MR MR KNEE*R* W/O CM
5 of 6 series · 34 of 40 positions shown · non-contrast
Comparison: 07/04/2003

CLINICAL DATA: Right knee pain for 7 days. Patellar instability.
Weakness.

EXAM:
MRI OF THE RIGHT KNEE WITHOUT CONTRAST
TECHNIQUE: Multiplanar, multisequence MR imaging of the knee was performed. No
intravenous contrast was administered.

[Series 7: PD fat-sat · axial · right · 3.0mm · 0.42mm/px · z∈[-84,+44]mm · 8 of 40 slices shown (1 of 3)]
[im 1/40]
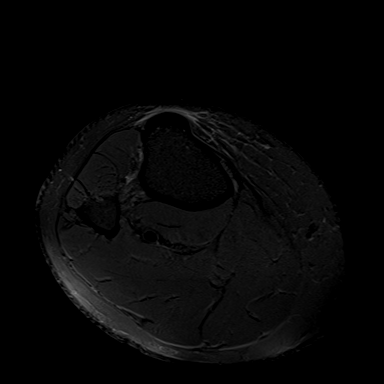
[im 6/40]
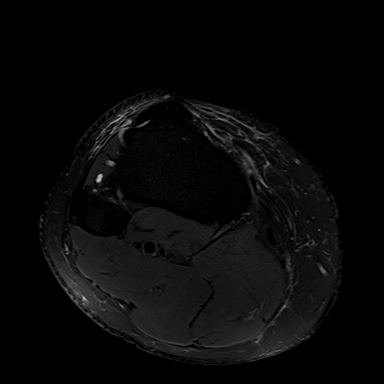
[im 12/40]
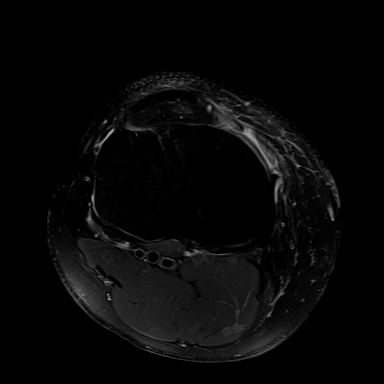
[im 17/40]
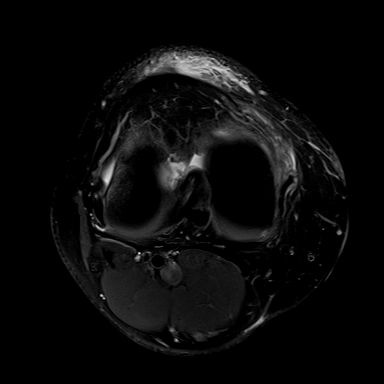
[im 23/40]
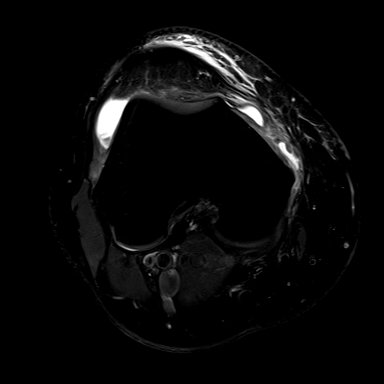
[im 28/40]
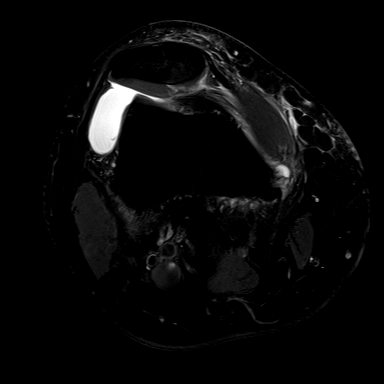
[im 34/40]
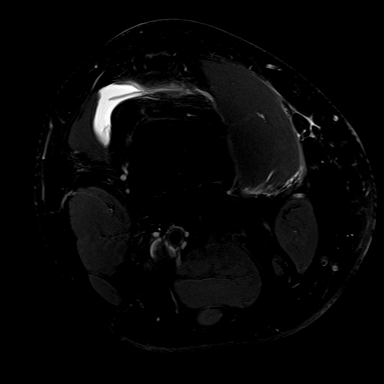
[im 40/40]
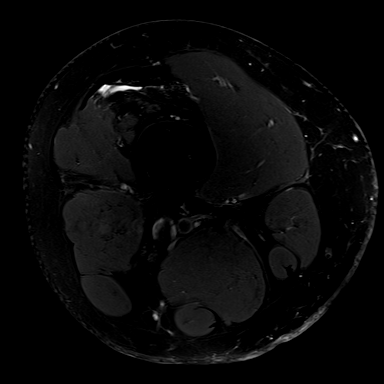

[Series 8: T1 · coronal · right · 3.0mm · 0.42mm/px · 3 of 40 slices shown]
[im 1/40]
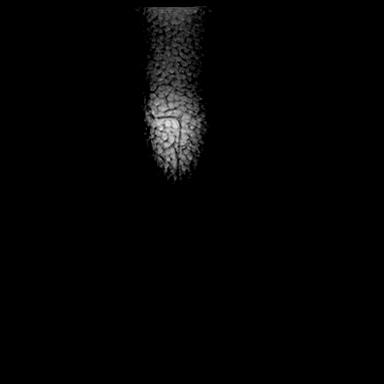
[im 6/40]
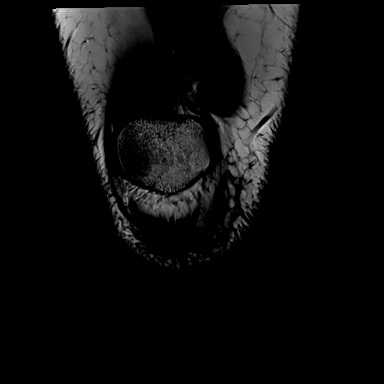
[im 12/40]
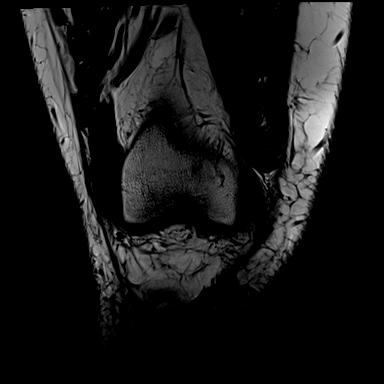

[Series 9: PD fat-sat · coronal · right · 3.0mm · 0.42mm/px · 8 of 38 slices shown (2 of 3)]
[im 1/38]
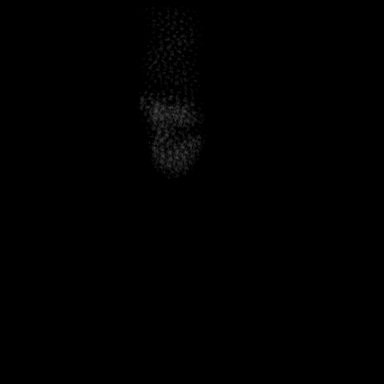
[im 6/38]
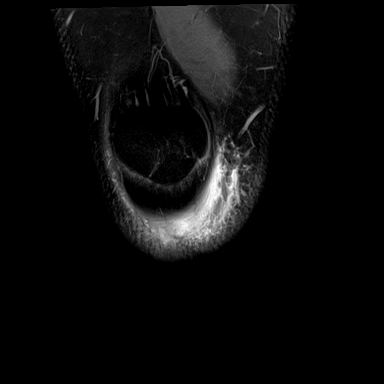
[im 11/38]
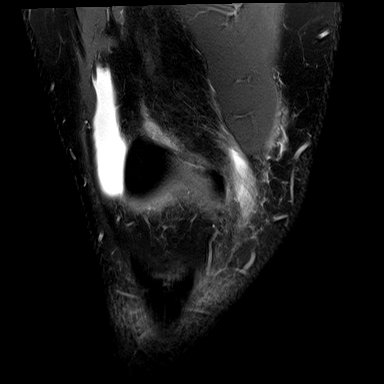
[im 16/38]
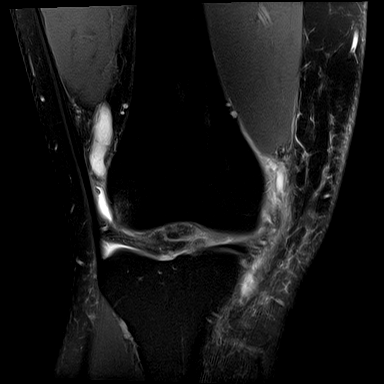
[im 22/38]
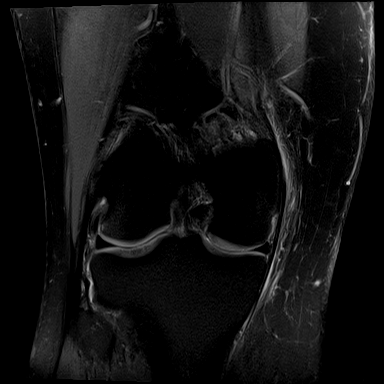
[im 27/38]
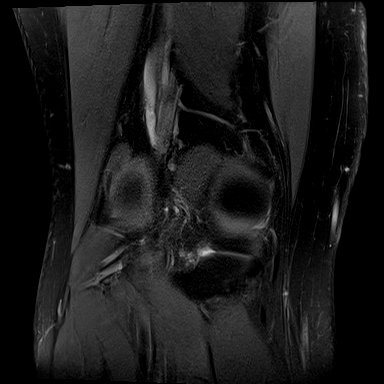
[im 32/38]
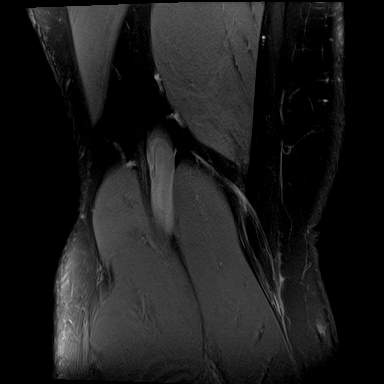
[im 38/38]
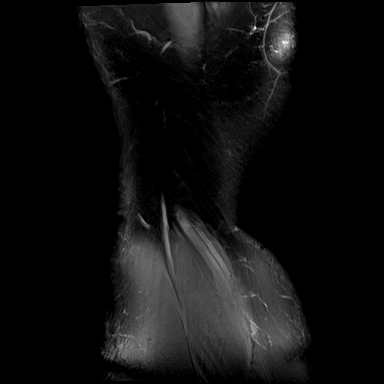

[Series 10: PD fat-sat · sagittal · right · 3.0mm · 0.42mm/px · 7 of 34 slices shown (3 of 3)]
[im 1/34]
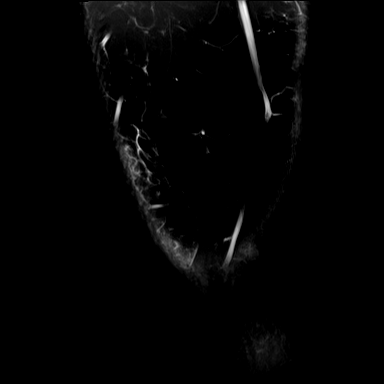
[im 6/34]
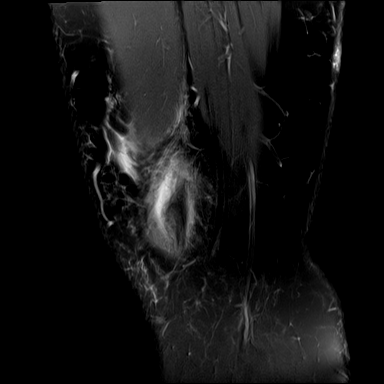
[im 12/34]
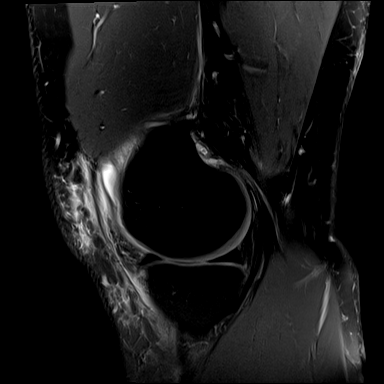
[im 17/34]
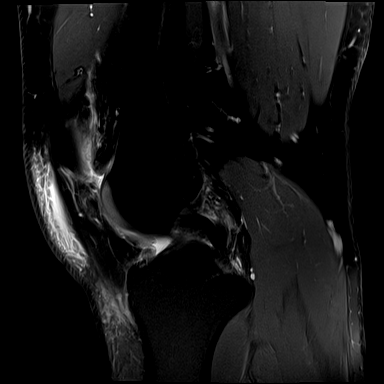
[im 23/34]
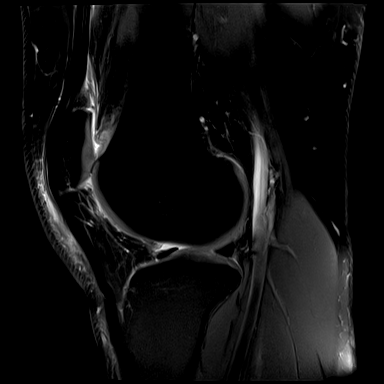
[im 28/34]
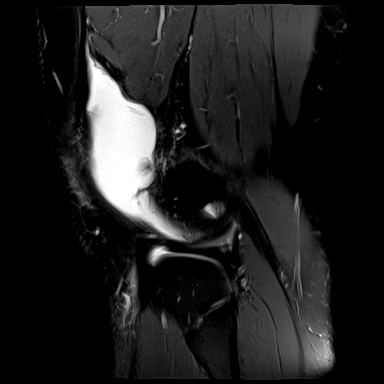
[im 34/34]
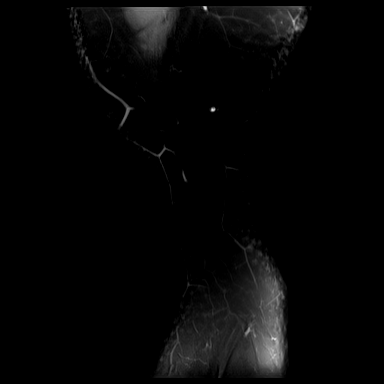

[Series 11: T2 fat-sat · coronal · right · 3.0mm · 0.42mm/px · 8 of 38 slices shown]
[im 1/38]
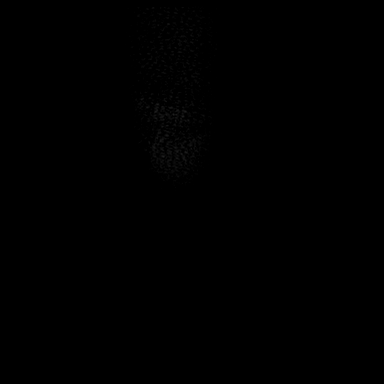
[im 6/38]
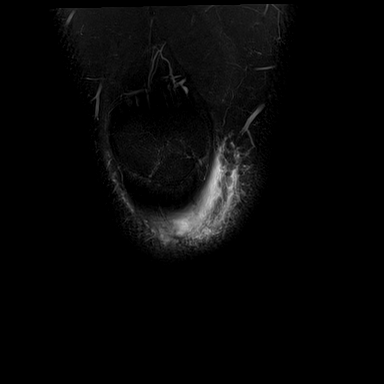
[im 11/38]
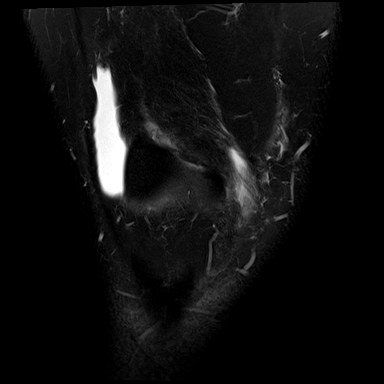
[im 16/38]
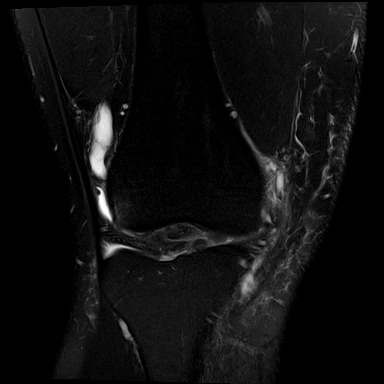
[im 22/38]
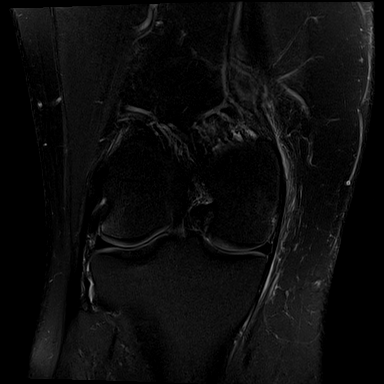
[im 27/38]
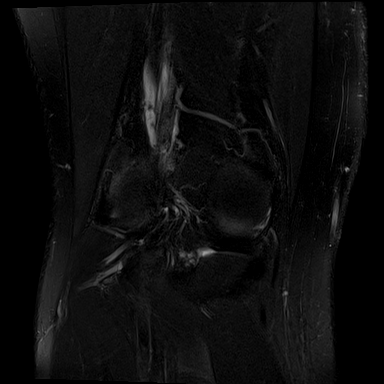
[im 32/38]
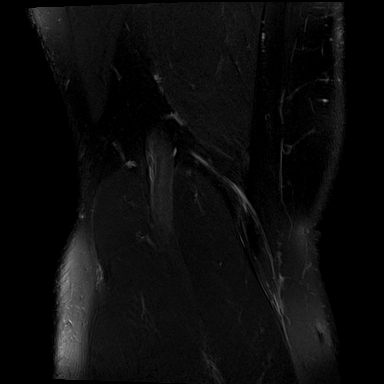
[im 38/38]
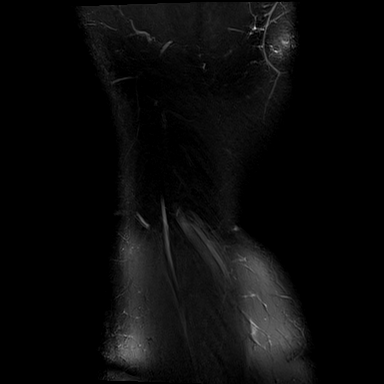

[34 of 40 positions shown; findings below may reference images not displayed]

FINDINGS: MENISCI

Medial meniscus:  Unremarkable

Lateral meniscus:  Unremarkable

LIGAMENTS

Cruciates:  Unremarkable

Collaterals:  Partially torn MCL anteriorly.

CARTILAGE

Patellofemoral: Large chondral fissure along the posterior patellar
ridge extending into the medial condyle, images 12-13 of series 7
and images 18-20 of series 10.

Medial:  Unremarkable

Lateral:  Articular cartilage intact.

Joint:  Moderate knee effusion.  Low-level edema in Hoffa's fat pad.

Popliteal Fossa:  Unremarkable

Extensor Mechanism: Torn medial retinaculum medially. Torn medial
patellofemoral ligament near the femoral attachment. Prepatellar
edema and prepatellar bursitis.

Bones:  Low-level edema laterally along the lateral femoral condyle.
IMPRESSION: 1. Torn medial patellar retinaculum, medial patellofemoral ligament,
and partially torn anterior portion of the MCL.
2. There is abnormal chondral fissuring along the medial patellar
facet and adjacent posterior patellar ridge, as well as some
low-level marrow edema anterolaterally along the lateral femoral
condyle. Both of these findings could relate to a prior transient
patellar dislocation.
3. Moderate knee effusion.
4. Prepatellar edema and prepatellar bursitis.

## 2022-04-17 ENCOUNTER — Other Ambulatory Visit (HOSPITAL_BASED_OUTPATIENT_CLINIC_OR_DEPARTMENT_OTHER): Payer: Self-pay

## 2022-04-17 MED ORDER — AMOXICILLIN-POT CLAVULANATE 875-125 MG PO TABS
1.0000 | ORAL_TABLET | Freq: Two times a day (BID) | ORAL | 0 refills | Status: DC
  Filled 2022-04-17: qty 20, 10d supply, fill #0

## 2022-05-25 DIAGNOSIS — S60012A Contusion of left thumb without damage to nail, initial encounter: Secondary | ICD-10-CM | POA: Diagnosis not present

## 2022-12-07 NOTE — Progress Notes (Unsigned)
Subjective:   Ronnie Baker 05-29-1985 12/08/2022  CC: Chief Complaint  Patient presents with   New Patient (Initial Visit)    Pt is here today to get established with the practice and also would like to get his physical performed.    HPI:  Ronnie Baker presents today to establish care at [facility] at [facility]. Introduced to Publishing rights manager role and practice setting.  All questions answered.   Last PCP: [facility]  Last annual physical: 2023 Concerns: See below   HEALTH SCREENINGS: - Vision Screening: not applicable - Dental Visits: up to date - Testicular Exam: Declined - STD Screening: Declined - PSA (50+): Not applicable  No results found for: "PSA1", "PSA"   - Colonoscopy (45+): Not applicable  Discussed with patient purpose of the colonoscopy is to detect colon cancer at curable precancerous or early stages  - AAA Screening: Not applicable  Men age 52-75 who have ever smoked - Lung Cancer screening with low-dose CT: Not applicable-  Adults age 18-80 who are current cigarette smokers or quit within the last 15 years. Must have 20 pack year history.   Depression and Anxiety Screen done today and results listed below:     12/08/2022    9:44 AM  Depression screen PHQ 2/9  Decreased Interest 0  Down, Depressed, Hopeless 0  PHQ - 2 Score 0  Altered sleeping 0  Tired, decreased energy 0  Change in appetite 1  Feeling bad or failure about yourself  0  Trouble concentrating 0  Moving slowly or fidgety/restless 0  Suicidal thoughts 0  PHQ-9 Score 1  Difficult doing work/chores Not difficult at all      12/08/2022    9:44 AM  GAD 7 : Generalized Anxiety Score  Nervous, Anxious, on Edge 0  Control/stop worrying 0  Worry too much - different things 0  Trouble relaxing 0  Restless 0  Easily annoyed or irritable 0  Afraid - awful might happen 0  Total GAD 7 Score 0  Anxiety Difficulty Not  difficult at all    IMMUNIZATIONS:  - Tdap: Tetanus vaccination status reviewed: Last Td in 2011, will update today. - Influenza: Postponed to flu season - Pneumovax: Not applicable - Prevnar: Not applicable - Zostavax vaccine (50+): Not applicable   Past medical history, surgical history, medications, allergies, family history and social history reviewed with patient today and changes made to appropriate areas of the chart.   Past Medical History:  Diagnosis Date   Closed dislocation of right patella 04/08/2015    Past Surgical History:  Procedure Laterality Date   HERNIA REPAIR     x 2 hernia   INSERTION OF MESH  06/08/2012   Procedure: INSERTION OF MESH;  Surgeon: Shelly Rubenstein, MD;  Location: [facility] OR;  Service: General;  Laterality: N/A;   UMBILICAL HERNIA REPAIR  06/08/2012   Procedure: HERNIA REPAIR UMBILICAL ADULT;  Surgeon: Shelly Rubenstein, MD;  Location: [facility] OR;  Service: General;  Laterality: N/A;    Current Outpatient Medications on File Prior to Visit  Medication Sig   Acetaminophen (TYLENOL PO) Take 2 tablets by mouth as needed.   ibuprofen (ADVIL) 800 MG tablet Take 800 mg by mouth every 8 (eight) hours as needed for mild pain or moderate pain.   No current facility-administered medications on file prior to visit.    Allergies  Allergen Reactions   Bee Venom Hives and Swelling  Social History   Socioeconomic History   Marital status: Married    Spouse name: Not on file   Number of children: Not on file   Years of education: Not on file   Highest education level: Not on file  Occupational History   Not on file  Tobacco Use   Smoking status: Never   Smokeless tobacco: Not on file  Vaping Use   Vaping status: Never Used  Substance and Sexual Activity   Alcohol use: Yes    Comment: rare   Drug use: No   Sexual activity: Not on file  Other Topics Concern   Not on file  Social History Narrative   Not on file   Social Determinants of  Health   Financial Resource Strain: Not on file  Food Insecurity: Not on file  Transportation Needs: Not on file  Physical Activity: Not on file  Stress: Not on file  Social Connections: Not on file  Intimate Partner Violence: Not on file   Social History   Tobacco Use  Smoking Status Never  Smokeless Tobacco Not on file   Social History   Substance and Sexual Activity  Alcohol Use Yes   Comment: rare     Family History  Problem Relation Age of Onset   Hypertension Mother    Asthma Father    Diabetes Maternal Grandmother    Cancer Maternal Grandmother        lung   Cancer Maternal Grandfather        gastric   Diabetes Paternal Grandmother      ROS: Denies fever, fatigue, unexplained weight loss/gain, CP, SHOB, and palpatitations. Denies neurological deficits, gastrointestinal and/or genitourinary complaints, and skin changes.   Objective:   Today's Vitals   12/08/22 0940  BP: 122/89  Pulse: 83  SpO2: 98%  Weight: 244 lb 12.8 oz (111 kg)  Height: 5\' 8"  (1.727 m)    GENERAL APPEARANCE: Well-appearing, in NAD. Well nourished.  SKIN: Pink, warm and dry. Turgor normal. No rash, lesion, ulceration, or ecchymoses. Hair evenly distributed.  HEENT: HEAD: Normocephalic.  EYES: PERRLA. EOMI. Lids intact w/o defect. Sclera white, Conjunctiva pink w/o exudate.  EARS: External ear w/o redness, swelling, masses or lesions. EAC clear. TM's intact, translucent w/o bulging, appropriate landmarks visualized. Appropriate acuity to conversational tones.  NOSE: Septum midline w/o deformity. Nares patent, mucosa pink and non-inflamed w/o drainage. No sinus tenderness.  THROAT: Uvula midline. Oropharynx clear. Tonsils non-inflamed w/o exudate. Oral mucosa pink and moist.  NECK: Supple, Trachea midline. Full ROM w/o pain or tenderness. No lymphadenopathy. Thyroid non-tender w/o enlargement or palpable masses.  RESPIRATORY: Chest wall symmetrical w/o masses. Respirations even and  non-labored. Breath sounds clear to auscultation bilaterally. No wheezes, rales, rhonchi, or crackles. CARDIAC: S1, S2 present, regular rate and rhythm. No gallops, murmurs, rubs, or clicks. PMI w/o lifts, heaves, or thrills. No carotid bruits. Capillary refill <2 seconds. Peripheral pulses 2+ bilaterally. GI: Abdomen soft w/o distention. Normoactive bowel sounds. No palpable masses or tenderness. No guarding or rebound tenderness. Liver and spleen w/o tenderness or enlargement. No CVA tenderness.  GU: Pt deferred exam. MSK: Muscle tone and strength appropriate for age, w/o atrophy or abnormal movement. EXTREMITIES: Active ROM intact, w/o tenderness, crepitus, or contracture. No obvious joint deformities or effusions. No clubbing, edema, or cyanosis.  NEUROLOGIC: CN's II-XII intact. Motor strength symmetrical with no obvious weakness. No sensory deficits. DTR 2+ symmetric bilaterally. Steady, even gait.  PSYCH/MENTAL STATUS: Alert, oriented x 3. Cooperative, appropriate  mood and affect.     Assessment & Plan:  1. Annual physical exam Discussed preventative exam, will obtain fasting labs today and Tdap updated in office today. Pt denies other concerns at this time and is doing well overall. May change time of follow up pending labs results.   - Lipid panel - TSH - Comprehensive metabolic panel - CBC with Differential/Platelet - Hemoglobin A1c - Tdap vaccine greater than or equal to 7yo IM  2. Obesity (BMI 35.0-39.9 without comorbidity) Pt requested referral for nutrition counseling due to weight gain. Will refer to [facility] for weight management, nutrition counseling and resources. Pt agreeable.   - Amb Ref to Medical Weight Management    Orders Placed This Encounter  Procedures   Tdap vaccine greater than or equal to 7yo IM   Lipid panel   TSH   Comprehensive metabolic panel   CBC with Differential/Platelet   Hemoglobin A1c   Amb Ref to Medical Weight  Management    Referral Priority:   Routine    Referral Type:   Consultation    Number of Visits Requested:   1    PATIENT COUNSELING: - Encouraged to adjust caloric intake to maintain or achieve ideal body weight, to reduce intake of dietary saturated fat and total fat, to limit sodium intake by avoiding high sodium foods and not adding table salt, and to maintain adequate dietary potassium and calcium preferably from fresh fruits, vegetables, and low-fat dairy products.   - Advised to avoid cigarette smoking. - Discussed with the patient that most people either abstain from alcohol or drink within safe limits (<=14/week and <=4 drinks/occasion for males, <=7/weeks and <= 3 drinks/occasion for females) and that the risk for alcohol disorders and other health effects rises proportionally with the number of drinks per week and how often a drinker exceeds daily limits. - Discussed cessation/primary prevention of drug use and availability of treatment for abuse.   - Stressed the importance of regular exercise - Injury prevention: Discussed safety belts, safety helmets, smoke detector, smoking near bedding or upholstery.  - Dental health: Discussed importance of regular tooth brushing, flossing, and dental visits.  - Sexuality: Discussed sexually transmitted diseases, partner selection, use of condoms, avoidance of unintended pregnancy  and contraceptive alternatives.   NEXT PREVENTATIVE PHYSICAL DUE IN 1 YEAR.  Return in about 1 year (around 12/08/2023) for ANNUAL PHYSICAL.  Patient to reach out to office if new, worrisome, or unresolved symptoms arise or if no improvement in patient's condition. Patient verbalized understanding and is agreeable to treatment plan. All questions answered to patient's satisfaction.    Yolanda Manges, FNP

## 2022-12-08 ENCOUNTER — Encounter (HOSPITAL_BASED_OUTPATIENT_CLINIC_OR_DEPARTMENT_OTHER): Payer: Self-pay | Admitting: Family Medicine

## 2022-12-08 ENCOUNTER — Ambulatory Visit (INDEPENDENT_AMBULATORY_CARE_PROVIDER_SITE_OTHER): Payer: 59 | Admitting: Family Medicine

## 2022-12-08 VITALS — BP 122/89 | HR 83 | Ht 68.0 in | Wt 244.8 lb

## 2022-12-08 DIAGNOSIS — Z23 Encounter for immunization: Secondary | ICD-10-CM | POA: Diagnosis not present

## 2022-12-08 DIAGNOSIS — E669 Obesity, unspecified: Secondary | ICD-10-CM | POA: Diagnosis not present

## 2022-12-08 DIAGNOSIS — Z0000 Encounter for general adult medical examination without abnormal findings: Secondary | ICD-10-CM

## 2022-12-08 DIAGNOSIS — K4090 Unilateral inguinal hernia, without obstruction or gangrene, not specified as recurrent: Secondary | ICD-10-CM | POA: Insufficient documentation

## 2022-12-08 NOTE — Patient Instructions (Signed)
Please get your lab work today.  We will notify you of results.   If [facility] is not covered, please let us know.

## 2022-12-09 ENCOUNTER — Encounter (HOSPITAL_BASED_OUTPATIENT_CLINIC_OR_DEPARTMENT_OTHER): Payer: Self-pay | Admitting: Family Medicine

## 2022-12-09 ENCOUNTER — Other Ambulatory Visit (HOSPITAL_BASED_OUTPATIENT_CLINIC_OR_DEPARTMENT_OTHER): Payer: Self-pay

## 2022-12-09 ENCOUNTER — Other Ambulatory Visit (HOSPITAL_BASED_OUTPATIENT_CLINIC_OR_DEPARTMENT_OTHER): Payer: Self-pay | Admitting: Family Medicine

## 2022-12-09 DIAGNOSIS — E785 Hyperlipidemia, unspecified: Secondary | ICD-10-CM | POA: Insufficient documentation

## 2022-12-09 DIAGNOSIS — R7301 Impaired fasting glucose: Secondary | ICD-10-CM | POA: Insufficient documentation

## 2022-12-09 MED ORDER — ATORVASTATIN CALCIUM 10 MG PO TABS
10.0000 mg | ORAL_TABLET | Freq: Every evening | ORAL | 3 refills | Status: DC
  Filled 2022-12-09: qty 90, 90d supply, fill #0
  Filled 2023-03-23: qty 90, 90d supply, fill #1
  Filled 2023-10-30 (×2): qty 90, 90d supply, fill #2

## 2022-12-09 NOTE — Progress Notes (Signed)
Please let patient know that his lab results have returned and his cholesterol is significantly elevated for a male his age.  Does have a strong family history of elevated cholesterol?  This puts him at 2 times the average risk for heart disease compared to a male his age.  I would recommend starting a statin therapy and also initiating omega-3 fish oil daily.  His A1c was also elevated and puts him in the level of prediabetes.  He needs to make significant dietary changes with reduction in carbohydrates, fats, and sugars.  And start with regular exercise 30 minutes a day.  We initially put a follow-up for 1 year, but I would like to see him back in 6 months for follow-up of prediabetes and HLD with fasting labs prior.  Please schedule him for this and let me know if he is agreeable to starting statin therapy.The ASCVD Risk score (Arnett DK, et al., 2019) failed to calculate for the following reasons:

## 2022-12-23 DIAGNOSIS — R7303 Prediabetes: Secondary | ICD-10-CM | POA: Insufficient documentation

## 2022-12-23 NOTE — Progress Notes (Signed)
Office: [phone]  /  Fax: [phone]   TeleHealth Visit:  This visit was completed with telemedicine (audio/video) technology. Ronnie Baker has verbally consented to this TeleHealth visit. The patient is located at home, the provider is located at home. The participants in this visit include the listed provider and patient. The visit was conducted today via MyChart video.  Initial Visit  Ronnie Baker was seen via virtual visit today to evaluate for treatment of obesity. He is interested in losing weight to improve overall health and reduce the risk of weight related complications. He presents today to review program treatment options, initial physical assessment, and evaluation.     Height: 5\' 8"  Weight: 244 lbs BMI: 37  He was referred by: PCP  When asked what else they would like to accomplish? He states: {EMHopetoaccomplish:28304}  Weight history: Began struggling with weight ***  When asked how has your weight affected you? He states: {EMWeightAffected:28305}  Some associated conditions: {EMSomeConditions:28306}  Contributing factors: {EMcontributingfactors:28307}  Weight promoting medications identified: {EMWeightpromotingrx:28308}  Current nutrition plan: {EMNutritionplan:28309::"None"}  Current level of physical activity: {EMcurrentPA:28310::"None"}  Current or previous pharmacotherapy: {EM previousRx:28311}  Response to medication: {EMResponsetomedication:28312}  Past medical history includes:   Past Medical History:  Diagnosis Date   Closed dislocation of right patella 04/08/2015   HLD (hyperlipidemia)    IFG (impaired fasting glucose)      Objective:     General:  Alert, oriented and cooperative. Patient is in no acute distress.  Respiratory: Normal respiratory effort, no problems with respiration noted  Mental Status: Normal mood and affect. Normal behavior. Normal judgment and thought content.    Assessment and Plan:   1. Hyperlipidemia LDL is  not at goal. Most recent lipid profile: LDL elevated at 174, HDL normal at 41, triglycerides elevated at 265. Medication(s): Lipitor 10 mg daily  Lab Results  Component Value Date   CHOL 265 (H) 12/08/2022   HDL 41 12/08/2022   LDLCALC 174 (H) 12/08/2022   TRIG 265 (H) 12/08/2022   CHOLHDL 6.5 (H) 12/08/2022   Lab Results  Component Value Date   ALT 23 12/08/2022   AST 20 12/08/2022   ALKPHOS 70 12/08/2022   BILITOT 0.2 12/08/2022   The ASCVD Risk score (Arnett DK, et al., 2019) failed to calculate for the following reasons:   The 2019 ASCVD risk score is only valid for ages 27 to 70  Plan: Continue statin. Work on lifestyle interventions in conjunction with our program.  2. Prediabetes Last A1c was 5.7.  Medication(s): None  Lab Results  Component Value Date   HGBA1C 5.7 (H) 12/08/2022   No results found for: "INSULIN"  Plan: Work on lifestyle interventions in conjunction with our program.  3. Obesity Treatment / Action Plan:  Will complete provided nutritional and psychosocial assessment questionnaire before the next appointment. Will be scheduled for indirect calorimetry to determine resting energy expenditure in a fasting state.  This will allow [place] to create a reduced calorie, high-protein meal plan to promote loss of fat mass while preserving muscle mass. Was counseled on pharmacotherapy and role as an adjunct in weight management.   Obesity Education Performed Today:  We discussed obesity as a disease and the importance of a more detailed evaluation of all the factors contributing to the disease.  We discussed the importance of long term lifestyle changes which include nutrition, exercise and behavioral modifications as well as the importance of customizing this to his specific health and social needs.  We discussed the  benefits of reaching a healthier weight to alleviate the symptoms of existing conditions and reduce the risks of the biomechanical, metabolic  and psychological effects of obesity.  Ronnie Baker appears to be in the action stage of change and states they are ready to start intensive lifestyle modifications and behavioral modifications.  ______________________________________________________________________________  He will be contacted by [facility] to set up initial appointment and the first follow up appointment with a physician.   The following office policies were discussed. He voiced understanding: - Patient will be considered late at 6 minutes past appointment time.   - For the first office visit, patient needs to arrive 1 hour early, fasting except for water. Patient should arrive 15 minutes early for all other visits. -  Patient will bring completed new patient paperwork to first office visit.  If not, appointment will be rescheduled.  30 minutes was spent today on this visit including the above counseling, pre-visit chart review, and post-visit documentation.  Reviewed by clinician on day of visit: allergies, medications, problem list, medical history, surgical history, family history, social history, and previous encounter notes pertinent to obesity diagnosis.    Jesse Sans, FNP

## 2022-12-24 ENCOUNTER — Encounter (INDEPENDENT_AMBULATORY_CARE_PROVIDER_SITE_OTHER): Payer: Self-pay | Admitting: Family Medicine

## 2022-12-24 ENCOUNTER — Telehealth (INDEPENDENT_AMBULATORY_CARE_PROVIDER_SITE_OTHER): Payer: 59 | Admitting: Family Medicine

## 2022-12-24 DIAGNOSIS — E7849 Other hyperlipidemia: Secondary | ICD-10-CM

## 2022-12-24 DIAGNOSIS — E669 Obesity, unspecified: Secondary | ICD-10-CM

## 2022-12-24 DIAGNOSIS — E785 Hyperlipidemia, unspecified: Secondary | ICD-10-CM

## 2022-12-24 DIAGNOSIS — Z6837 Body mass index (BMI) 37.0-37.9, adult: Secondary | ICD-10-CM | POA: Diagnosis not present

## 2022-12-24 DIAGNOSIS — R7303 Prediabetes: Secondary | ICD-10-CM

## 2022-12-29 DIAGNOSIS — Z0289 Encounter for other administrative examinations: Secondary | ICD-10-CM

## 2023-01-12 ENCOUNTER — Ambulatory Visit (INDEPENDENT_AMBULATORY_CARE_PROVIDER_SITE_OTHER): Payer: 59 | Admitting: Family Medicine

## 2023-01-12 ENCOUNTER — Encounter: Payer: Self-pay | Admitting: Family Medicine

## 2023-01-12 VITALS — BP 136/83 | HR 84 | Temp 97.6°F | Ht 68.0 in | Wt 245.0 lb

## 2023-01-12 DIAGNOSIS — R0602 Shortness of breath: Secondary | ICD-10-CM

## 2023-01-12 DIAGNOSIS — Z1331 Encounter for screening for depression: Secondary | ICD-10-CM | POA: Diagnosis not present

## 2023-01-12 DIAGNOSIS — Z6837 Body mass index (BMI) 37.0-37.9, adult: Secondary | ICD-10-CM

## 2023-01-12 DIAGNOSIS — F3289 Other specified depressive episodes: Secondary | ICD-10-CM

## 2023-01-12 DIAGNOSIS — R7303 Prediabetes: Secondary | ICD-10-CM

## 2023-01-12 DIAGNOSIS — E669 Obesity, unspecified: Secondary | ICD-10-CM | POA: Diagnosis not present

## 2023-01-12 DIAGNOSIS — R5383 Other fatigue: Secondary | ICD-10-CM

## 2023-01-12 DIAGNOSIS — E7849 Other hyperlipidemia: Secondary | ICD-10-CM | POA: Diagnosis not present

## 2023-01-12 NOTE — Progress Notes (Signed)
Chief Complaint:   OBESITY Ronnie Baker (MR# [id number]) is a 37 y.o. male who presents for evaluation and treatment of obesity and related comorbidities. Current BMI is Body mass index is 37.25 kg/m. Ronnie Baker has been struggling with his weight for many years and has been unsuccessful in either losing weight, maintaining weight loss, or reaching his healthy weight goal.  Ronnie Baker lost weight about 25 lb last year counting calories.  Ronnie Baker was about 200 lb at age 28.  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  He lives with his wife and 2 children.  He does not exercise regularly. He averages about 4,000 steps daily.  He eats out frequently, craves junk food and is a picky eater.  He drinks regular soda, juice.  He is a stress and boredom eater.    Ronnie Baker is currently in the action stage of change and ready to dedicate time achieving and maintaining a healthier weight. Ronnie Baker is interested in becoming our patient and working on intensive lifestyle modifications including (but not limited to) diet and exercise for weight loss.  Ronnie Baker's habits were reviewed today and are as follows: His family eats meals together, he thinks his family will eat healthier with him, he struggles with family and or coworkers weight loss sabotage, his desired weight loss is 65 lb, he started gaining weight at the age of 25 years, his heaviest weight ever was 240 pounds, he is a picky eater and doesn't like to eat healthier foods, he has significant food cravings issues, he snacks frequently in the evenings, he skips meals frequently, he is frequently drinking liquids with calories, he frequently makes poor food choices, he frequently eats larger portions than normal, and he struggles with emotional eating.  Depression Screen Ronnie Baker's Food and Mood (modified PHQ-9) score was 6.  Subjective:   1. Other fatigue Ronnie Baker admits to daytime somnolence and admits to waking up still tired. Patient has a  history of symptoms of daytime fatigue and morning fatigue and morning headaches. Ronnie Baker generally gets about 6-8  hours of sleep per night, and states that he has nightime awakenings. Snoring is present. Apneic episodes are not present. Epworth Sleepiness Score is 4. EKG, NSR, 88 BPM without ischemia.  2. SOB (shortness of breath) on exertion Ronnie Baker notes increasing shortness of breath with exercising and seems to be worsening over time with weight gain. He notes getting out of breath sooner with activity than he used to. This has not gotten worse recently. Ronnie Baker denies shortness of breath at rest or orthopnea.  3. Other hyperlipidemia Patient is on atorvastatin 10 mg daily.  On 12/08/2022, FLP, total cholesterol 265, triglycerides 265, HDL 41, LDL 174.  Patient has occasional cravings.  4. Prediabetes On 12/08/2022, A1c was 5.7.  Patient has never tried metformin.  Patient craves starches and sweets.  5. Other depression, with emotional eating Bariatric PHQ-9:6.  Patient reports moderately high stress levels.  Assessment/Plan:   1. Other fatigue Ronnie Baker does feel that his weight is causing his energy to be lower than it should be. Fatigue may be related to obesity, depression or many other causes. Labs will be ordered, and in the meanwhile, Ronnie Baker will focus on self care including making healthy food choices, increasing physical activity and focusing on stress reduction. Update lab today.  Improve sleep time to hours per night.   - EKG 12-Lead - Insulin, random - VITAMIN D 25 Hydroxy (Vit-D Deficiency, Fractures) - Vitamin B12 -  Folate - Testosterone  2. SOB (shortness of breath) on exertion Ronnie Baker does feel that he gets out of breath more easily that he used to when he exercises. Ronnie Baker's shortness of breath appears to be obesity related and exercise induced. He has agreed to work on weight loss and gradually increase exercise to treat his exercise induced shortness of breath. Will continue  to monitor closely.  3. Other hyperlipidemia Begin prescribed meal plan.  Recheck FLP in 6 months.  4. Prediabetes Check fasting insulin today.  Began prescribed meal plan.  5. Other depression, with emotional eating Began working on keeping junk food snacks out of the house.  Work on stress reduction and consider Wellbutrin.  6. Depression screening Ronnie Baker had a positive depression screening. Depression is commonly associated with obesity and often results in emotional eating behaviors. We will monitor this closely and work on CBT to help improve the non-hunger eating patterns. Referral to Psychology may be required if no improvement is seen as he continues in our clinic.  7. Obesity, starting BMI 37.3  8. BMI 37.0-37.9, adult Ronnie Baker is currently in the action stage of change and his goal is to continue with weight loss efforts. I recommend Ronnie Baker begin the structured treatment plan as follows:  He has agreed to the Category 4 Plan.   100 Calorie snack list given.  Cut out SSB's.  Track steps with a smart watch.   Exercise goals: All adults should avoid inactivity. Some physical activity is better than none, and adults who participate in any amount of physical activity gain some health benefits.   Behavioral modification strategies: increasing lean protein intake, decreasing simple carbohydrates, increasing vegetables, increasing water intake, decreasing eating out, no skipping meals, meal planning and cooking strategies, keeping healthy foods in the home, better snacking choices, and planning for success.  He was informed of the importance of frequent follow-up visits to maximize his success with intensive lifestyle modifications for his multiple health conditions. He was informed we would discuss his lab results at his next visit unless there is a critical issue that needs to be addressed sooner. Ronnie Baker agreed to keep his next visit at the agreed upon time to discuss these  results.  Objective:   Blood pressure 136/83, pulse 84, temperature 97.6 F (36.4 C), height 5\' 8"  (1.727 m), weight 245 lb (111.1 kg), SpO2 97%. Body mass index is 37.25 kg/m.  EKG: Normal sinus rhythm, rate 88 BPM.  Indirect Calorimeter completed today shows a VO2 of 319 and a REE of 2203.  His calculated basal metabolic rate is 4098 thus his basal metabolic rate is worse than expected.  General: Cooperative, alert, well developed, in no acute distress. HEENT: Conjunctivae and lids unremarkable. Cardiovascular: Regular rhythm.  Lungs: Normal work of breathing. Neurologic: No focal deficits.   Lab Results  Component Value Date   CREATININE 1.12 12/08/2022   BUN 16 12/08/2022   NA 141 12/08/2022   K 4.7 12/08/2022   CL 105 12/08/2022   CO2 17 (L) 12/08/2022   Lab Results  Component Value Date   ALT 23 12/08/2022   AST 20 12/08/2022   ALKPHOS 70 12/08/2022   BILITOT 0.2 12/08/2022   Lab Results  Component Value Date   HGBA1C 5.7 (H) 12/08/2022   No results found for: "INSULIN" Lab Results  Component Value Date   TSH 2.900 12/08/2022   Lab Results  Component Value Date   CHOL 265 (H) 12/08/2022   HDL 41 12/08/2022   LDLCALC  174 (H) 12/08/2022   TRIG 265 (H) 12/08/2022   CHOLHDL 6.5 (H) 12/08/2022   Lab Results  Component Value Date   WBC 6.6 12/08/2022   HGB 15.2 12/08/2022   HCT 46.5 12/08/2022   MCV 81 12/08/2022   PLT 301 12/08/2022   No results found for: "IRON", "TIBC", "FERRITIN"  Attestation Statements:   Reviewed by clinician on day of visit: allergies, medications, problem list, medical history, surgical history, family history, social history, and previous encounter notes.  Time spent on visit including pre-visit chart review and post-visit charting and care was 40 minutes.   I, Malcolm Metro, am acting as Energy manager for Seymour Bars, DO.   I have reviewed the above documentation for accuracy and completeness, and I agree with the  above. Glennis Brink, DO

## 2023-01-13 LAB — INSULIN, RANDOM: INSULIN: 29.4 u[IU]/mL — ABNORMAL HIGH (ref 2.6–24.9)

## 2023-01-13 LAB — TESTOSTERONE: Testosterone: 313 ng/dL (ref 264–916)

## 2023-01-13 LAB — FOLATE: Folate: 5.8 ng/mL (ref >3.0)

## 2023-01-13 LAB — VITAMIN B12: Vitamin B-12: 522 pg/mL (ref 232–1245)

## 2023-01-13 LAB — VITAMIN D 25 HYDROXY (VIT D DEFICIENCY, FRACTURES): Vit D, 25-Hydroxy: 24.9 ng/mL — ABNORMAL LOW (ref 30.0–100.0)

## 2023-01-21 ENCOUNTER — Telehealth (INDEPENDENT_AMBULATORY_CARE_PROVIDER_SITE_OTHER): Payer: Self-pay | Admitting: Family Medicine

## 2023-01-21 NOTE — Telephone Encounter (Signed)
09/19 Pt cx appt due to $50.00 Co-Pay that he has to pay each visit with [facility] [employer]. Patient stated that he is awaiting a call back from [place]. [facility].

## 2023-01-25 ENCOUNTER — Ambulatory Visit: Payer: 59 | Admitting: Family Medicine

## 2023-02-08 ENCOUNTER — Other Ambulatory Visit (HOSPITAL_BASED_OUTPATIENT_CLINIC_OR_DEPARTMENT_OTHER): Payer: Self-pay

## 2023-02-08 MED ORDER — INFLUENZA VIRUS VACC SPLIT PF (FLUZONE) 0.5 ML IM SUSY
0.5000 mL | PREFILLED_SYRINGE | Freq: Once | INTRAMUSCULAR | 0 refills | Status: AC
  Filled 2023-02-08: qty 0.5, 1d supply, fill #0

## 2023-02-15 ENCOUNTER — Telehealth (INDEPENDENT_AMBULATORY_CARE_PROVIDER_SITE_OTHER): Payer: Self-pay

## 2023-02-15 NOTE — Telephone Encounter (Signed)
Pt called to speak with Harriett Sine. He states that the two of you were to follow up regarding his insurance coverage last month.

## 2023-02-16 NOTE — Telephone Encounter (Signed)
I spoke with the patient and sent a follow-up email to [employer].

## 2023-03-04 NOTE — Telephone Encounter (Signed)
Attempted to reach the patient to f/u on his billing issue after receiving a response from [employer] that a reconsideration request has been submitted with [employer], they have placed the patient's account in contested status, and the patient should not receive an additional bill until there is a response from Ronnie Baker. The current turnaround time is 45 business days. No answer, left a generic message to return the call.

## 2023-04-08 ENCOUNTER — Telehealth (INDEPENDENT_AMBULATORY_CARE_PROVIDER_SITE_OTHER): Payer: Self-pay

## 2023-04-08 NOTE — Telephone Encounter (Signed)
This patient called in wanting to speak with you again regarding his co-pay. Please call him back at [phone]. Patient states he will be available all day today.  Thank you

## 2023-04-08 NOTE — Telephone Encounter (Signed)
I spoke with the patient, who has received another bill for the $50 copay. Emailed [employer] again questioning the bill, as I don't see the account is in contested status, and have not received communication of a response from [place] yet.

## 2023-04-09 ENCOUNTER — Encounter (HOSPITAL_BASED_OUTPATIENT_CLINIC_OR_DEPARTMENT_OTHER): Payer: Self-pay | Admitting: Family Medicine

## 2023-04-12 ENCOUNTER — Other Ambulatory Visit (HOSPITAL_BASED_OUTPATIENT_CLINIC_OR_DEPARTMENT_OTHER): Payer: Self-pay

## 2023-04-12 DIAGNOSIS — R0683 Snoring: Secondary | ICD-10-CM

## 2023-04-12 DIAGNOSIS — R0981 Nasal congestion: Secondary | ICD-10-CM

## 2023-04-13 ENCOUNTER — Encounter (INDEPENDENT_AMBULATORY_CARE_PROVIDER_SITE_OTHER): Payer: Self-pay | Admitting: Otolaryngology

## 2023-04-29 ENCOUNTER — Ambulatory Visit (INDEPENDENT_AMBULATORY_CARE_PROVIDER_SITE_OTHER): Payer: 59 | Admitting: Family Medicine

## 2023-04-29 ENCOUNTER — Encounter (HOSPITAL_BASED_OUTPATIENT_CLINIC_OR_DEPARTMENT_OTHER): Payer: Self-pay | Admitting: Family Medicine

## 2023-04-29 ENCOUNTER — Other Ambulatory Visit (HOSPITAL_BASED_OUTPATIENT_CLINIC_OR_DEPARTMENT_OTHER): Payer: Self-pay

## 2023-04-29 VITALS — BP 119/86 | HR 80 | Ht 68.0 in | Wt 253.0 lb

## 2023-04-29 DIAGNOSIS — R202 Paresthesia of skin: Secondary | ICD-10-CM

## 2023-04-29 DIAGNOSIS — E7849 Other hyperlipidemia: Secondary | ICD-10-CM | POA: Diagnosis not present

## 2023-04-29 DIAGNOSIS — R200 Anesthesia of skin: Secondary | ICD-10-CM | POA: Diagnosis not present

## 2023-04-29 DIAGNOSIS — R7303 Prediabetes: Secondary | ICD-10-CM | POA: Diagnosis not present

## 2023-04-29 MED ORDER — GABAPENTIN 100 MG PO CAPS
100.0000 mg | ORAL_CAPSULE | Freq: Three times a day (TID) | ORAL | 3 refills | Status: DC | PRN
Start: 2023-04-29 — End: 2024-02-22
  Filled 2023-04-29: qty 90, 30d supply, fill #0

## 2023-04-29 NOTE — Progress Notes (Signed)
Subjective:   Ronnie Baker [birth year] 04/29/2023  Chief Complaint  Patient presents with   feet tingling    Patient has had tingling in feet for about 3 weeks. Tried magnesium to see if that would help which he said did help some during the day but now nothing is helping with it.    HPI: Ronnie Baker presents today new concern of numbness and tingling in his feet.   NUMBNESS Duration:  Onset: Patient states approx. 3 weeks ago, he had a sensation to both of his feet feeling " as if something is crawling on it". He tried to take Magnesium which provided minimal relief initially. He states now the tingling has progressed to all day and is located to both feet. Describes it as "pins and needles" and feels like "something is crawling on it". He has reported some cramping to toes intermittent.   He denies swelling, weakness or pain to bilateral lower extremities.  He is a prediabetic with last A1c in August at 5.7.  He is currently treated for hyperlipidemia with statin therapy and is tolerating well.  Denies myalgias.  He did have a visit with healthy weight and wellness in which random insulin, vitamin D, folate, and vitamin B12 were checked.  Vitamin D was slightly low.  Denies any recent injuries, falls, heavy lifting, or illness.  Patient reports he drinks alcohol occasionally and reports his last drink was a glass of wine in October.  Denies paresthesias to groin, incontinence of bowel or bladder, weakness of lower extremities.  Denies symptoms of restless legs.   The following portions of the patient's history were reviewed and updated as appropriate: past medical history, past surgical history, family history, social history, allergies, medications, and problem list.   Patient Active Problem List   Diagnosis Date Noted   Prediabetes 12/23/2022   HLD (hyperlipidemia)    IFG (impaired fasting glucose)    Testicular hernia 12/08/2022   Umbilical hernia 05/31/2012   Past  Medical History:  Diagnosis Date   Closed dislocation of right patella 04/08/2015   High cholesterol    HLD (hyperlipidemia)    IFG (impaired fasting glucose)    Past Surgical History:  Procedure Laterality Date   HERNIA REPAIR     x 2 hernia   INSERTION OF MESH  06/08/2012   Procedure: INSERTION OF MESH;  Surgeon: Shelly Rubenstein, MD;  Location: [facility] OR;  Service: General;  Laterality: N/A;   UMBILICAL HERNIA REPAIR  06/08/2012   Procedure: HERNIA REPAIR UMBILICAL ADULT;  Surgeon: Shelly Rubenstein, MD;  Location: [facility] OR;  Service: General;  Laterality: N/A;   Family History  Problem Relation Age of Onset   Hypertension Mother    High Cholesterol Mother    Asthma Father    Diabetes Maternal Grandmother    Cancer Maternal Grandmother        lung   Cancer Maternal Grandfather        gastric   Diabetes Paternal Grandmother    Outpatient Medications Prior to Visit  Medication Sig Dispense Refill   Acetaminophen (TYLENOL PO) Take 2 tablets by mouth as needed.     atorvastatin (LIPITOR) 10 MG tablet Take 1 tablet (10 mg total) by mouth at bedtime. 90 tablet 3   ibuprofen (ADVIL) 800 MG tablet Take 800 mg by mouth every 8 (eight) hours as needed for mild pain (pain score 1-3) or moderate pain (pain score 4-6).     loratadine (CLARITIN)  10 MG tablet Take 10 mg by mouth daily.     Magnesium 200 MG TABS Take 1 tablet by mouth daily.     No facility-administered medications prior to visit.   Allergies  Allergen Reactions   Bee Venom Hives and Swelling     ROS: A complete ROS was performed with pertinent positives/negatives noted in the HPI. The remainder of the ROS are negative.    Objective:   Today's Vitals   04/29/23 1351  BP: 119/86  Pulse: 80  SpO2: 100%  Weight: 253 lb (114.8 kg)  Height: 5\' 8"  (1.727 m)    Physical Exam          GENERAL: Well-appearing, in NAD. Well nourished.  SKIN: Pink, warm and dry. No rash, lesion, ulceration, or ecchymoses.  Head:  Normocephalic. NECK: Trachea midline. Full ROM w/o pain or tenderness RESPIRATORY: Chest wall symmetrical. Respirations even and non-labored.  MSK: Muscle tone and strength appropriate for age. Joints w/o tenderness, redness, or swelling.  Crepitus, step-off, or deformity to spine.  No point tenderness to spine or bilateral hips.  Full range of motion present to spine and all extremities. EXTREMITIES: Without clubbing, cyanosis, or edema.  Negative straight leg raise test bilaterally. NEUROLOGIC: No motor or sensory deficits. Steady, even gait. C2-C12 intact.  PSYCH/MENTAL STATUS: Alert, oriented x 3. Cooperative, appropriate mood and affect.   Title   Diabetic Foot Exam - detailed Date & Time: 04/29/2023  2:10 PM Diabetic Foot exam was performed with the following findings: Yes  Visual Foot Exam completed.: Yes  Is there a history of foot ulcer?: No Is there a foot ulcer now?: No Is there swelling?: No Is there elevated skin temperature?: No Is there abnormal foot shape?: No Is there a claw toe deformity?: No Are the toenails long?: No Are the toenails thick?: No Are the toenails ingrown?: No Is the skin thin, fragile, shiny and hairless?": No Normal Range of Motion?: Yes Is there foot or ankle muscle weakness?: No Do you have pain in calf while walking?: No Are the shoes appropriate in style and fit?: Yes Can the patient see the bottom of their feet?: Yes Pulse Foot Exam completed.: Yes   Right Posterior Tibialis: Present Left posterior Tibialis: Present   Right Dorsalis Pedis: Present Left Dorsalis Pedis: Present     Sensory Foot Exam Completed.: Yes Semmes-Weinstein Monofilament Test "+" means "has sensation" and "-" means "no sensation"  R Foot Test Control: Pos L Foot Test Control: Pos   R Site 1-Great Toe: Pos L Site 1-Great Toe: Pos   R Site 4: Pos L Site 4: Pos   R site 5: Pos L Site 5: Pos  R Site 6: Pos L Site 6: Pos     Image components are not supported.    Image components are not supported. Image components are not supported.  Tuning Fork Right vibratory: present Left vibratory: present  Comments       Assessment & Plan:  1. Numbness and tingling of both feet (Primary) Possibly due to progression of diabetes, will obtain A1c with lab work today.  Will also check magnesium, electrolytes with CMP, and thyroid to ascertain cause of numbness and tingling.  If labs are unremarkable, recommend obtaining lumbar spine x-ray and possible referral to orthopedics or spinal specialist.  Patient will use gabapentin 100 mg as needed up to 3 times daily for symptom relief.  We discussed safe use of this medication patient verbalized understanding.  Will plan for  follow-up as scheduled in February or sooner if needed - gabapentin (NEURONTIN) 100 MG capsule; Take 1 capsule (100 mg total) by mouth 3 (three) times daily as needed (nerve pain).  Dispense: 90 capsule; Refill: 3 - Comprehensive metabolic panel - Hemoglobin A1c - Lipid panel - Magnesium - TSH+T4F+T3Free  2. Prediabetes Will obtain A1c today.  Foot exam completed in office and is unremarkable for loss of sensation currently. - Hemoglobin A1c  3. Other hyperlipidemia Will obtain lipid panel and CMP as patient is on statin therapy to evaluate aggressive of hyperlipidemia. - Comprehensive metabolic panel - Lipid panel   Meds ordered this encounter  Medications   gabapentin (NEURONTIN) 100 MG capsule    Sig: Take 1 capsule (100 mg total) by mouth 3 (three) times daily as needed (nerve pain).    Dispense:  90 capsule    Refill:  3    Supervising Provider:   DE Peru, RAYMOND J [[id number]]   Lab Orders         Comprehensive metabolic panel         Hemoglobin A1c         Lipid panel         Magnesium         TSH+T4F+T3Free      Return for As scheduled Feb 2025.    Patient to reach out to office if new, worrisome, or unresolved symptoms arise or if no improvement in patient's  condition. Patient verbalized understanding and is agreeable to treatment plan. All questions answered to patient's satisfaction.    Hilbert Bible, [place]

## 2023-04-29 NOTE — Patient Instructions (Signed)
Pending labs, we may reschedule your February appt.   Use the gabapentin at night. You can increase the dose slowly if you need to.

## 2023-04-30 LAB — COMPREHENSIVE METABOLIC PANEL
ALT: 56 [IU]/L — ABNORMAL HIGH (ref 0–44)
AST: 21 [IU]/L (ref 0–40)
Albumin: 4.5 g/dL (ref 4.1–5.1)
Alkaline Phosphatase: 75 [IU]/L (ref 44–121)
BUN/Creatinine Ratio: 12 (ref 9–20)
BUN: 14 mg/dL (ref 6–20)
Bilirubin Total: <0.2 mg/dL (ref 0.0–1.2)
CO2: 22 mmol/L (ref 20–29)
Calcium: 9.3 mg/dL (ref 8.7–10.2)
Chloride: 102 mmol/L (ref 96–106)
Creatinine, Ser: 1.21 mg/dL (ref 0.76–1.27)
Globulin, Total: 2.5 g/dL (ref 1.5–4.5)
Glucose: 99 mg/dL (ref 70–99)
Potassium: 4 mmol/L (ref 3.5–5.2)
Sodium: 140 mmol/L (ref 134–144)
Total Protein: 7 g/dL (ref 6.0–8.5)
eGFR: 79 mL/min/{1.73_m2} (ref >59)

## 2023-04-30 LAB — LIPID PANEL
Chol/HDL Ratio: 5.3 {ratio} — ABNORMAL HIGH (ref 0.0–5.0)
Cholesterol, Total: 153 mg/dL (ref 100–199)
HDL: 29 mg/dL — ABNORMAL LOW (ref >39)
LDL Chol Calc (NIH): 89 mg/dL (ref 0–99)
Triglycerides: 206 mg/dL — ABNORMAL HIGH (ref 0–149)
VLDL Cholesterol Cal: 35 mg/dL (ref 5–40)

## 2023-04-30 LAB — TSH+T4F+T3FREE
Free T4: 1.21 ng/dL (ref 0.82–1.77)
T3, Free: 3.2 pg/mL (ref 2.0–4.4)
TSH: 3.23 u[IU]/mL (ref 0.450–4.500)

## 2023-04-30 LAB — MAGNESIUM: Magnesium: 1.9 mg/dL (ref 1.6–2.3)

## 2023-04-30 LAB — HEMOGLOBIN A1C
Est. average glucose Bld gHb Est-mCnc: 111 mg/dL
Hgb A1c MFr Bld: 5.5 % (ref 4.8–5.6)

## 2023-05-03 NOTE — Progress Notes (Signed)
Hi Ronnie Baker,  One of your liver enzymes is slightly elevated could be due to a fatty meal or recent alcohol intake.  Your cholesterol has improved with the atorvastatin started in August, but your triglyceride level is still slightly elevated.  Your total cholesterol, LDL and VLDL has significantly improved with starting the Lipitor.  I would recommend that you start a omega-3 fish oil supplement of 1000 mg daily.  We could also send this in as a prescription for you as well to help your triglyceride number.  Your A1c has improved from 4 months ago and is out of the prediabetic range.  The exam and thyroid levels are normal.  If you are still having the sensation changes in both of your feet, I recommend that we get some imaging of your lower back to see if there is any radiculopathy involved in contributing.  If you would like to proceed with this let me know and I will go ahead and place the order.

## 2023-05-24 ENCOUNTER — Encounter (HOSPITAL_BASED_OUTPATIENT_CLINIC_OR_DEPARTMENT_OTHER): Payer: Self-pay | Admitting: Family Medicine

## 2023-05-25 ENCOUNTER — Other Ambulatory Visit (HOSPITAL_BASED_OUTPATIENT_CLINIC_OR_DEPARTMENT_OTHER): Payer: Self-pay

## 2023-05-25 ENCOUNTER — Other Ambulatory Visit (HOSPITAL_BASED_OUTPATIENT_CLINIC_OR_DEPARTMENT_OTHER): Payer: Self-pay | Admitting: Family Medicine

## 2023-05-25 ENCOUNTER — Institutional Professional Consult (permissible substitution) (INDEPENDENT_AMBULATORY_CARE_PROVIDER_SITE_OTHER): Payer: 59

## 2023-05-25 DIAGNOSIS — E781 Pure hyperglyceridemia: Secondary | ICD-10-CM | POA: Insufficient documentation

## 2023-05-25 MED ORDER — OMEGA-3-ACID ETHYL ESTERS 1 G PO CAPS
2.0000 g | ORAL_CAPSULE | Freq: Two times a day (BID) | ORAL | 5 refills | Status: DC
  Filled 2023-05-25: qty 120, 30d supply, fill #0

## 2023-05-26 ENCOUNTER — Other Ambulatory Visit (HOSPITAL_BASED_OUTPATIENT_CLINIC_OR_DEPARTMENT_OTHER): Payer: Self-pay

## 2023-05-26 NOTE — Telephone Encounter (Signed)
Received a prior authorization request from pharmacy for the lovaza. Checked with pharmacy staff to see what was preferred and found out that pt has to try/fail fenofibrate prior to lovaza being covered.   Routing to [employer].

## 2023-06-01 ENCOUNTER — Other Ambulatory Visit (HOSPITAL_BASED_OUTPATIENT_CLINIC_OR_DEPARTMENT_OTHER): Payer: Self-pay

## 2023-06-08 ENCOUNTER — Other Ambulatory Visit (HOSPITAL_BASED_OUTPATIENT_CLINIC_OR_DEPARTMENT_OTHER): Payer: Commercial Managed Care - PPO

## 2023-06-11 ENCOUNTER — Ambulatory Visit (INDEPENDENT_AMBULATORY_CARE_PROVIDER_SITE_OTHER): Payer: Commercial Managed Care - PPO | Admitting: Family Medicine

## 2023-06-11 ENCOUNTER — Encounter (HOSPITAL_BASED_OUTPATIENT_CLINIC_OR_DEPARTMENT_OTHER): Payer: Self-pay | Admitting: Family Medicine

## 2023-06-11 VITALS — BP 131/85 | HR 97 | Ht 68.0 in | Wt 243.6 lb

## 2023-06-11 DIAGNOSIS — R202 Paresthesia of skin: Secondary | ICD-10-CM | POA: Insufficient documentation

## 2023-06-11 DIAGNOSIS — R200 Anesthesia of skin: Secondary | ICD-10-CM

## 2023-06-11 NOTE — Progress Notes (Signed)
 Subjective:   Ronnie Baker February 03, 1986 06/11/2023  Chief Complaint  Patient presents with   Medical Management of Chronic Issues    Patient states he is still having the numbness/tingling in his feet which has not gotten any better even with the gabapentin  so he stopped taking it.    HPI: LAMONDRE WESCHE presents today for re-assessment and management of chronic medical conditions.  Peripheral neuropathy:  Asencion Loveday is a 38 year old male with history of prediabetes, hyperlipidemia presenting for reassessment of peripheral neuropathy to bilateral feet.  Approximately 1 to 2 months ago he began having sensation to both of his feet that felt like a crawling sensation.  He tried to take magnesium which provided minimal relief initially.  He states the pins and needle sensation is to bilateral feet and some cramping to his toes.  He states he has tingling throughout the day and is worsening at night.  He was placed on gabapentin  approximately 1 month ago to try for peripheral neuropathy possibly related to prediabetes.  Patient tried this for approximately 1 to 2 weeks and reports no improvement.  Patient has stopped gabapentin  at this time.  He is concerned that his atorvastatin  may be contributing a rare side effect of peripheral neuropathy and would be interested in trialing withdrawal from atorvastatin  for 1 to 2 weeks to see if this would help.  He denies any recent accidents, injuries, weight lifting, and bases that could be contributing to a musculoskeletal accident or nerve impingement.  Denies weakness, bladder or bowel incontinence, back pain, groin paresthesia.  The following portions of the patient's history were reviewed and updated as appropriate: past medical history, past surgical history, family history, social history, allergies, medications, and problem list.   Patient Active Problem List   Diagnosis Date Noted   Numbness and tingling of both feet 06/11/2023    Hypertriglyceridemia 05/25/2023   Prediabetes 12/23/2022   HLD (hyperlipidemia)    IFG (impaired fasting glucose)    Testicular hernia 12/08/2022   Umbilical hernia 05/31/2012   Past Medical History:  Diagnosis Date   Closed dislocation of right patella 04/08/2015   High cholesterol    HLD (hyperlipidemia)    IFG (impaired fasting glucose)    Past Surgical History:  Procedure Laterality Date   HERNIA REPAIR     x 2 hernia   INSERTION OF MESH  06/08/2012   Procedure: INSERTION OF MESH;  Surgeon: Vicenta DELENA Poli, MD;  Location: [facility] OR;  Service: General;  Laterality: N/A;   UMBILICAL HERNIA REPAIR  06/08/2012   Procedure: HERNIA REPAIR UMBILICAL ADULT;  Surgeon: Vicenta DELENA Poli, MD;  Location: [facility] OR;  Service: General;  Laterality: N/A;   Family History  Problem Relation Age of Onset   Hypertension Mother    High Cholesterol Mother    Asthma Father    Diabetes Maternal Grandmother    Cancer Maternal Grandmother        lung   Cancer Maternal Grandfather        gastric   Diabetes Paternal Grandmother    Outpatient Medications Prior to Visit  Medication Sig Dispense Refill   Acetaminophen  (TYLENOL  PO) Take 2 tablets by mouth as needed.     atorvastatin  (LIPITOR) 10 MG tablet Take 1 tablet (10 mg total) by mouth at bedtime. 90 tablet 3   ibuprofen (ADVIL) 800 MG tablet Take 800 mg by mouth every 8 (eight) hours as needed for mild pain (pain score 1-3) or moderate pain (  pain score 4-6).     loratadine (CLARITIN) 10 MG tablet Take 10 mg by mouth daily.     Magnesium 200 MG TABS Take 1 tablet by mouth daily.     Omega 3 1000 MG CAPS Take 1,000 mg by mouth daily.     gabapentin  (NEURONTIN ) 100 MG capsule Take 1 capsule (100 mg total) by mouth 3 (three) times daily as needed (nerve pain). (Patient not taking: Reported on 06/11/2023) 90 capsule 3   omega-3 acid ethyl esters (LOVAZA ) 1 g capsule Take 2 capsules (2 g total) by mouth 2 (two) times daily. 120 capsule 5   No  facility-administered medications prior to visit.   Allergies  Allergen Reactions   Bee Venom Hives and Swelling     ROS: A complete ROS was performed with pertinent positives/negatives noted in the HPI. The remainder of the ROS are negative.    Objective:   Today's Vitals   06/11/23 0820  BP: 131/85  Pulse: 97  SpO2: 97%  Weight: 243 lb 9.6 oz (110.5 kg)  Height: 5' 8 (1.727 m)    Physical Exam          GENERAL: Well-appearing, in NAD. Well nourished.  SKIN: Pink, warm and dry. No rash, lesion, ulceration, or ecchymoses.  Head: Normocephalic. NECK: Trachea midline. Full ROM w/o pain or tenderness. RESPIRATORY: Chest wall symmetrical. Respirations even and non-labored.  MSK: Muscle tone and strength appropriate for age. Joints w/o tenderness, redness, or swelling.  No reproducible pain with flexion, internal or external rotation of lower extremities.  No step-off, deformity or crepitus to spine.  Patient has full range of motion. EXTREMITIES: Without clubbing, cyanosis, or edema.  NEUROLOGIC: No motor or sensory deficits. Steady, even gait. C2-C12 intact.  PSYCH/MENTAL STATUS: Alert, oriented x 3. Cooperative, appropriate mood and affect.      Assessment & Plan:  1. Numbness and tingling of both feet (Primary) Patient will trial stopping his atorvastatin  for approximately 1 to 2 weeks to evaluate for possible adverse effects of medication of peripheral neuropathy.  If no improvement, PCP recommends imaging of lumbar back and referral to orthopedics or spine provider.  Patient agreeable to this plan and will reach out to PCP if no improvement with with holding his atorvastatin .  If improvement in peripheral neuropathy with stopping statin therapy, will consider Zetia  in the future.  Return in about 6 months (around 12/09/2023) for hyperlipidemia, prediabetes, neuropathy (Fasting labs at visit) .    Patient to reach out to office if new, worrisome, or unresolved symptoms arise  or if no improvement in patient's condition. Patient verbalized understanding and is agreeable to treatment plan. All questions answered to patient's satisfaction.    Thersia Schuyler Stark, [place]

## 2023-06-14 ENCOUNTER — Other Ambulatory Visit (HOSPITAL_BASED_OUTPATIENT_CLINIC_OR_DEPARTMENT_OTHER): Payer: Self-pay

## 2023-06-24 NOTE — Telephone Encounter (Signed)
 Mychart sent by pt:  Ronnie Baker [facility] (supporting Hilbert Bible, FNP)1 hour ago (11:53 AM)    Ronnie Baker,    After stopping the atorvastatin as we discussed in our last appt. I have noticed a decrease is the tingling sensation in my feet. It has went from 24/7 to I've only noticed it a couple of nights.     Routing to [employer] as an Financial planner.

## 2023-07-12 ENCOUNTER — Ambulatory Visit (INDEPENDENT_AMBULATORY_CARE_PROVIDER_SITE_OTHER): Admitting: Family Medicine

## 2023-07-12 ENCOUNTER — Encounter (HOSPITAL_BASED_OUTPATIENT_CLINIC_OR_DEPARTMENT_OTHER): Payer: Self-pay | Admitting: Family Medicine

## 2023-07-12 ENCOUNTER — Other Ambulatory Visit (HOSPITAL_BASED_OUTPATIENT_CLINIC_OR_DEPARTMENT_OTHER): Payer: Self-pay

## 2023-07-12 VITALS — BP 120/71 | HR 76 | Ht 68.0 in | Wt 244.2 lb

## 2023-07-12 DIAGNOSIS — R21 Rash and other nonspecific skin eruption: Secondary | ICD-10-CM | POA: Insufficient documentation

## 2023-07-12 MED ORDER — MUPIROCIN 2 % EX OINT
TOPICAL_OINTMENT | Freq: Two times a day (BID) | CUTANEOUS | 1 refills | Status: DC
  Filled 2023-07-12: qty 22, 30d supply, fill #0

## 2023-07-12 MED ORDER — VALACYCLOVIR HCL 1 G PO TABS
1000.0000 mg | ORAL_TABLET | Freq: Three times a day (TID) | ORAL | 0 refills | Status: DC
  Filled 2023-07-12: qty 21, 7d supply, fill #0

## 2023-07-12 NOTE — Progress Notes (Signed)
    Procedures performed today:    None.  Independent interpretation of notes and tests performed by another provider:   None.  Brief History, Exam, Impression, and Recommendations:    BP 120/71 (BP Location: Left Arm, Patient Position: Sitting, Cuff Size: Large)   Pulse 76   Ht 5\' 8"  (1.727 m)   Wt 244 lb 3.2 oz (110.8 kg)   SpO2 98%   BMI 37.13 kg/m   Rash Assessment & Plan: Patient presents for evaluation of rash.  He noted this over the last week or so with lesions primarily occurring under his arm extending as they developed along bilateral flanks.  He has also had involvement proximal lower extremities.  He reports associated itching, some discomfort as well.  Will note some pain when rolling over in bed and there is pressure on the lesions.  Some lesions have been fluid-filled, some have healed over.  He denies any issues with fever, fatigue.  Does have 1 area with increased local redness and soreness.  Denies any new medications.  No exposure that he knows of, however did have recent trip to [place]. On exam, patient is in no acute distress, vital signs stable.  Along sides of trunk bilaterally, patient does have lesions consisting of some fluid-filled vesicles which are filled with whitish fluid primarily.  Lesions with erythematous base.  Some lesions appear healed over.  Similar lesions over the proximal lower extremities.  Does have 1 area over medial aspect of left thigh that is more diffusely erythematous. Uncertain etiology for current rash.  Concern for possible shingles, however unusual pattern for this and generally would be less likely for patient's age.  Ultimately, do think that this is could represent shingles and given that he has had new lesions occurring over the weekend, would be reasonable to proceed initiating antiviral therapy.  We discussed consideration for other medications to help with symptoms, patient declines at this time.  We also discussed consideration  related to potential exposure to others and precautions to take in this regard.  Advised on monitoring lesions closely.  If continuing to have issues with rash despite antiviral therapy or if having diffuse erythematous areas which could indicate signs of secondary bacterial infection, recommend returning to the office for further evaluation   Other orders -     valACYclovir HCl; Take 1 tablet (1,000 mg total) by mouth 3 (three) times daily.  Dispense: 21 tablet; Refill: 0 -     Mupirocin; Apply topically 2 (two) times daily.  Dispense: 22 g; Refill: 1  Return if symptoms worsen or fail to improve.   ___________________________________________ Laelia Angelo de [place], MD, ABFM, CAQSM Primary Care and Sports Medicine [facility]

## 2023-07-12 NOTE — Assessment & Plan Note (Signed)
 Patient presents for evaluation of rash.  He noted this over the last week or so with lesions primarily occurring under his arm extending as they developed along bilateral flanks.  He has also had involvement proximal lower extremities.  He reports associated itching, some discomfort as well.  Will note some pain when rolling over in bed and there is pressure on the lesions.  Some lesions have been fluid-filled, some have healed over.  He denies any issues with fever, fatigue.  Does have 1 area with increased local redness and soreness.  Denies any new medications.  No exposure that he knows of, however did have recent trip to [place]. On exam, patient is in no acute distress, vital signs stable.  Along sides of trunk bilaterally, patient does have lesions consisting of some fluid-filled vesicles which are filled with whitish fluid primarily.  Lesions with erythematous base.  Some lesions appear healed over.  Similar lesions over the proximal lower extremities.  Does have 1 area over medial aspect of left thigh that is more diffusely erythematous. Uncertain etiology for current rash.  Concern for possible shingles, however unusual pattern for this and generally would be less likely for patient's age.  Ultimately, do think that this is could represent shingles and given that he has had new lesions occurring over the weekend, would be reasonable to proceed initiating antiviral therapy.  We discussed consideration for other medications to help with symptoms, patient declines at this time.  We also discussed consideration related to potential exposure to others and precautions to take in this regard.  Advised on monitoring lesions closely.  If continuing to have issues with rash despite antiviral therapy or if having diffuse erythematous areas which could indicate signs of secondary bacterial infection, recommend returning to the office for further evaluation

## 2023-07-12 NOTE — Patient Instructions (Signed)

## 2023-07-16 ENCOUNTER — Other Ambulatory Visit (HOSPITAL_BASED_OUTPATIENT_CLINIC_OR_DEPARTMENT_OTHER): Payer: Self-pay

## 2023-08-06 ENCOUNTER — Telehealth (HOSPITAL_BASED_OUTPATIENT_CLINIC_OR_DEPARTMENT_OTHER): Payer: Self-pay | Admitting: *Deleted

## 2023-08-06 NOTE — Telephone Encounter (Signed)
 Please advise on patient billing

## 2023-08-06 NOTE — Telephone Encounter (Signed)
 Copied from CRM [id number]. Topic: Complaint (DO NOT CONVERT) - Billing/Coding >> Aug 06, 2023 11:34 AM Everette C wrote: DOS: 07/12/23 Details of complaint: The patient shares that they are being billed $153 for a specialist visit rather than a primary care sick visit  How would the patient like to see this issue resolved? The patient would ike to be contacted to discuss resolution when possible    Route to Research officer, political party.

## 2023-09-06 ENCOUNTER — Encounter (HOSPITAL_BASED_OUTPATIENT_CLINIC_OR_DEPARTMENT_OTHER): Payer: Self-pay | Admitting: Family Medicine

## 2023-09-06 DIAGNOSIS — R21 Rash and other nonspecific skin eruption: Secondary | ICD-10-CM

## 2023-10-19 ENCOUNTER — Telehealth (HOSPITAL_BASED_OUTPATIENT_CLINIC_OR_DEPARTMENT_OTHER): Payer: Self-pay | Admitting: *Deleted

## 2023-10-19 NOTE — Telephone Encounter (Signed)
 Copied from CRM [id number]. Topic: Referral - Status >> Oct 19, 2023 11:21 AM Crispin Dolphin wrote: Reason for CRM: Patient called states received a call from [facility]. No availability until January. He would like to know if there are any other options. States Dr Eddie Good [place] saw it but was not very sure of how to proceed. He wants to know if Trula Gable would like to see it or if she can suggest somewhere he can get in. Please give him a call back. Thank You

## 2023-10-19 NOTE — Telephone Encounter (Signed)
 Please advise since Trula Gable is out of office

## 2023-10-21 NOTE — Telephone Encounter (Signed)
 Patient advised with verbal understanding will call around as he has already called a couple and they are booking out further. Will let us  know if he can be seen sooner and we will place new referral

## 2023-10-30 ENCOUNTER — Other Ambulatory Visit (HOSPITAL_BASED_OUTPATIENT_CLINIC_OR_DEPARTMENT_OTHER): Payer: Self-pay

## 2023-11-30 DIAGNOSIS — L739 Follicular disorder, unspecified: Secondary | ICD-10-CM | POA: Diagnosis not present

## 2023-12-01 ENCOUNTER — Other Ambulatory Visit (HOSPITAL_BASED_OUTPATIENT_CLINIC_OR_DEPARTMENT_OTHER): Payer: Self-pay

## 2023-12-01 MED ORDER — DOXYCYCLINE HYCLATE 100 MG PO CAPS
100.0000 mg | ORAL_CAPSULE | Freq: Two times a day (BID) | ORAL | 1 refills | Status: DC
  Filled 2023-12-01: qty 28, 14d supply, fill #0

## 2023-12-09 ENCOUNTER — Ambulatory Visit (HOSPITAL_BASED_OUTPATIENT_CLINIC_OR_DEPARTMENT_OTHER): Payer: Commercial Managed Care - PPO | Admitting: Family Medicine

## 2023-12-10 ENCOUNTER — Ambulatory Visit (HOSPITAL_BASED_OUTPATIENT_CLINIC_OR_DEPARTMENT_OTHER): Admitting: Family Medicine

## 2024-01-24 ENCOUNTER — Other Ambulatory Visit (HOSPITAL_BASED_OUTPATIENT_CLINIC_OR_DEPARTMENT_OTHER): Payer: Self-pay

## 2024-01-24 MED ORDER — FLUZONE 0.5 ML IM SUSY
0.5000 mL | PREFILLED_SYRINGE | Freq: Once | INTRAMUSCULAR | 0 refills | Status: AC
  Filled 2024-01-24: qty 0.5, 1d supply, fill #0

## 2024-02-22 ENCOUNTER — Ambulatory Visit (INDEPENDENT_AMBULATORY_CARE_PROVIDER_SITE_OTHER): Admitting: Family Medicine

## 2024-02-22 ENCOUNTER — Encounter (HOSPITAL_BASED_OUTPATIENT_CLINIC_OR_DEPARTMENT_OTHER): Payer: Self-pay | Admitting: Family Medicine

## 2024-02-22 VITALS — BP 116/77 | HR 75 | Wt 247.0 lb

## 2024-02-22 DIAGNOSIS — Z789 Other specified health status: Secondary | ICD-10-CM | POA: Diagnosis not present

## 2024-02-22 DIAGNOSIS — E781 Pure hyperglyceridemia: Secondary | ICD-10-CM | POA: Diagnosis not present

## 2024-02-22 DIAGNOSIS — R7303 Prediabetes: Secondary | ICD-10-CM

## 2024-02-22 DIAGNOSIS — Z0000 Encounter for general adult medical examination without abnormal findings: Secondary | ICD-10-CM

## 2024-02-22 NOTE — Patient Instructions (Signed)

## 2024-02-22 NOTE — Progress Notes (Signed)
 Subjective:   Ronnie Baker May 29, 1985 02/22/2024  CC: Chief Complaint  Patient presents with   Annual Exam    Patient is here today for his physical. Denies any concerns for today's visit.    HPI: Ronnie Baker is a 38 y.o. male who presents for a routine health maintenance exam.  Labs collected at time of visit.   NEUROPATHY:  Patient states his previous symptoms of neuropathy have been alleviated by stopping his atorvastatin  for over 1 month. He states numbness and tingling are now resolved.   HEALTH SCREENINGS: - Vision Screening: up to date - Dental Visits: up to date - Testicular Exam: Declined - STD Screening: Declined - PSA (50+): Not applicable  No results found for: PSA1, PSA   - Colonoscopy (45+): Not applicable  Discussed with patient purpose of the colonoscopy is to detect colon cancer at curable precancerous or early stages  - AAA Screening: Not applicable  Men age 67-75 who have ever smoked - Lung Cancer screening with low-dose CT: Not applicable-  Adults age 58-80 who are current cigarette smokers or quit within the last 15 years. Must have 20 pack year history.   Depression and Anxiety Screen done today and results listed below:     02/22/2024    9:06 AM 07/12/2023    8:22 AM 06/11/2023    8:25 AM 04/29/2023    1:55 PM 12/08/2022    9:44 AM  Depression screen PHQ 2/9  Decreased Interest 0 0 0 0 0  Down, Depressed, Hopeless 0 0 0 0 0  PHQ - 2 Score 0 0 0 0 0  Altered sleeping 0 0 0 0 0  Tired, decreased energy 0 0 0 0 0  Change in appetite 0 0 0 0 1  Feeling bad or failure about yourself  0 0 0 0 0  Trouble concentrating 0 0 0 0 0  Moving slowly or fidgety/restless 0 0 0 0 0  Suicidal thoughts  0 0 0 0  PHQ-9 Score 0 0 0 0 1  Difficult doing work/chores Not difficult at all Not difficult at all Not difficult at all Not difficult at all Not difficult at all      02/22/2024    9:06 AM 07/12/2023    8:23 AM 06/11/2023    8:26 AM 04/29/2023     1:55 PM  GAD 7 : Generalized Anxiety Score  Nervous, Anxious, on Edge 0 0 0 0  Control/stop worrying 0 0 0 0  Worry too much - different things 0 0 0 0  Trouble relaxing 0 0 0 0  Restless 0 0 0 0  Easily annoyed or irritable 0 0 0 0  Afraid - awful might happen 0 0 0 0  Total GAD 7 Score 0 0 0 0  Anxiety Difficulty Not difficult at all Not difficult at all Not difficult at all Not difficult at all    IMMUNIZATIONS:  - Tdap: Tetanus vaccination status reviewed: last tetanus booster within 10 years. - Influenza: Up to date - Pneumovax: Not applicable - Prevnar: Not applicable - Shingrix vaccine (50+): Not applicable   Past medical history, surgical history, medications, allergies, family history and social history reviewed with patient today and changes made to appropriate areas of the chart.   Past Medical History:  Diagnosis Date   Closed dislocation of right patella 04/08/2015   High cholesterol    HLD (hyperlipidemia)    IFG (impaired fasting glucose)     Past  Surgical History:  Procedure Laterality Date   HERNIA REPAIR     x 2 hernia   INSERTION OF MESH  06/08/2012   Procedure: INSERTION OF MESH;  Surgeon: Vicenta DELENA Poli, MD;  Location: [facility] OR;  Service: General;  Laterality: N/A;   UMBILICAL HERNIA REPAIR  06/08/2012   Procedure: HERNIA REPAIR UMBILICAL ADULT;  Surgeon: Vicenta DELENA Poli, MD;  Location: [facility] OR;  Service: General;  Laterality: N/A;    Current Outpatient Medications on File Prior to Visit  Medication Sig   Acetaminophen  (TYLENOL  PO) Take 2 tablets by mouth as needed.   fluticasone (FLONASE) 50 MCG/ACT nasal spray Place into both nostrils daily.   ibuprofen (ADVIL) 800 MG tablet Take 800 mg by mouth every 8 (eight) hours as needed for mild pain (pain score 1-3) or moderate pain (pain score 4-6).   Omega 3 1000 MG CAPS Take 1,000 mg by mouth daily.   No current facility-administered medications on file prior to visit.    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      Social History   Socioeconomic History   Marital status: Married    Spouse name: Not on file   Number of children: Not on file   Years of education: Not on file   Highest education level: Not on file  Occupational History   Not on file  Tobacco Use   Smoking status: Never    Passive exposure: Never   Smokeless tobacco: Never  Vaping Use   Vaping status: Never Used  Substance and Sexual Activity   Alcohol use: Yes    Comment: rare   Drug use: No   Sexual activity: Not on file  Other Topics Concern   Not on file  Social History Narrative   Not on file   Social Drivers of Health   Financial Resource Strain: Low Risk  (12/08/2022)   Overall Financial Resource Strain (CARDIA)    Difficulty of Paying Living Expenses: Not very hard  Food Insecurity: No Food Insecurity (12/08/2022)   Hunger Vital Sign    Worried About Running Out of Food in the Last Year: Never true    Ran Out of Food in the Last Year: Never true  Transportation Needs: No Transportation Needs (12/08/2022)   PRAPARE - Administrator, Civil Service (Medical): No    Lack of Transportation (Non-Medical): No  Physical Activity: Insufficiently Active (12/08/2022)   Exercise Vital Sign    Days of Exercise per Week: 1 day    Minutes of Exercise per Session: 20 min  Stress: No Stress Concern Present (12/08/2022)   [employer] of Occupational Health - Occupational Stress Questionnaire    Feeling of Stress : Only a little  Social Connections: Moderately Integrated (12/08/2022)   Social Connection and Isolation Panel    Frequency of Communication with Friends and Family: More than three times a week    Frequency of Social Gatherings with Friends and Family: Once a week    Attends Religious Services: 1 to 4 times per year    Active Member of [employer] or Organizations: No    Attends Tax inspector Meetings: Never    Marital Status: Married  Catering manager Violence: Not At Risk (12/08/2022)   Humiliation, Afraid, Rape, and Kick questionnaire    Fear of Current or Ex-Partner: No    Emotionally Abused: No  Physically Abused: No    Sexually Abused: No   Social History   Tobacco Use  Smoking Status Never   Passive exposure: Never  Smokeless Tobacco Never   Social History   Substance and Sexual Activity  Alcohol Use Yes   Comment: rare     Family History  Problem Relation Age of Onset   Hypertension Mother    High Cholesterol Mother    Asthma Father    Diabetes Maternal Grandmother    Cancer Maternal Grandmother        lung   Cancer Maternal Grandfather        gastric   Diabetes Paternal Grandmother      ROS: Denies fever, fatigue, unexplained weight loss/gain, CP, SHOB, and palpatitations. Denies neurological deficits, gastrointestinal and/or genitourinary complaints, and skin changes.   Objective:   Today's Vitals   02/22/24 0900  BP: 116/77  Pulse: 75  SpO2: 99%  Weight: 247 lb (112 kg)    GENERAL APPEARANCE: Well-appearing, in NAD. Well nourished.  SKIN: Pink, warm and dry. Turgor normal. No rash, lesion, ulceration, or ecchymoses. Hair evenly distributed.  HEENT: HEAD: Normocephalic.  EYES: PERRLA. EOMI. Lids intact w/o defect. Sclera white, Conjunctiva pink w/o exudate.  EARS: External ear w/o redness, swelling, masses or lesions. EAC clear. TM's intact, translucent w/o bulging, appropriate landmarks visualized. Appropriate acuity to conversational tones.  NOSE: Septum midline w/o deformity. Nares patent, mucosa pink and non-inflamed w/o drainage. No sinus tenderness.  THROAT: Uvula midline. Oropharynx clear. Tonsils non-inflamed w/o exudate. Oral mucosa pink and moist.  NECK: Supple, Trachea midline. Full ROM w/o pain or tenderness. No lymphadenopathy. Thyroid  non-tender w/o enlargement or palpable masses.  RESPIRATORY: Chest wall  symmetrical w/o masses. Respirations even and non-labored. Breath sounds clear to auscultation bilaterally. No wheezes, rales, rhonchi, or crackles. CARDIAC: S1, S2 present, regular rate and rhythm. No gallops, murmurs, rubs, or clicks. PMI w/o lifts, heaves, or thrills. No carotid bruits. Capillary refill <2 seconds. Peripheral pulses 2+ bilaterally. GI: Abdomen soft w/o distention. Normoactive bowel sounds. No palpable masses or tenderness. No guarding or rebound tenderness. Liver and spleen w/o tenderness or enlargement. No CVA tenderness.  GU: Pt deferred exam. MSK: Muscle tone and strength appropriate for age, w/o atrophy or abnormal movement. EXTREMITIES: Active ROM intact, w/o tenderness, crepitus, or contracture. No obvious joint deformities or effusions. No clubbing, edema, or cyanosis.  NEUROLOGIC: CN's II-XII intact. Motor strength symmetrical with no obvious weakness. No sensory deficits. DTR 2+ symmetric bilaterally. Steady, even gait.  PSYCH/MENTAL STATUS: Alert, oriented x 3. Cooperative, appropriate mood and affect.     Assessment & Plan:  1. Annual physical exam (Primary) Discussed preventative screenings, vaccines, and healthy lifestyle with patient. Fasting labs will be completed today.  - CBC with Differential/Platelet - Comprehensive metabolic panel with GFR - Lipid panel - TSH - Hemoglobin A1c  2. Statin intolerance Discussed statin intolerance and recommend avoidance due to neuropathy and myalgias. Will obtain LP and discussed possible use of Zetia, Bempidoic acid, etc.   3. Hypertriglyceridemia Will obtain LP with labs today.  - Lipid panel  4. Prediabetes Previously controlled well with diet and exercise. Will check with A1C today.  - Hemoglobin A1c    Orders Placed This Encounter  Procedures   CBC with Differential/Platelet   Comprehensive metabolic panel with GFR   Lipid panel   TSH   Hemoglobin A1c    PATIENT COUNSELING: - Encouraged to adjust  caloric intake to maintain or achieve ideal body weight,  to reduce intake of dietary saturated fat and total fat, to limit sodium intake by avoiding high sodium foods and not adding table salt, and to maintain adequate dietary potassium and calcium  preferably from fresh fruits, vegetables, and low-fat dairy products.   - Advised to avoid cigarette smoking. - Discussed with the patient that most people either abstain from alcohol or drink within safe limits (<=14/week and <=4 drinks/occasion for males, <=7/weeks and <= 3 drinks/occasion for females) and that the risk for alcohol disorders and other health effects rises proportionally with the number of drinks per week and how often a drinker exceeds daily limits. - Discussed cessation/primary prevention of drug use and availability of treatment for abuse.   - Stressed the importance of regular exercise - Injury prevention: Discussed safety belts, safety helmets, smoke detector, smoking near bedding or upholstery.  - Dental health: Discussed importance of regular tooth brushing, flossing, and dental visits.  - Sexuality: Discussed sexually transmitted diseases, partner selection, use of condoms, avoidance of unintended pregnancy  and contraceptive alternatives.   NEXT PREVENTATIVE PHYSICAL DUE IN 1 YEAR.  Return in about 1 year (around 02/21/2025) for ANNUAL PHYSICAL.  Patient to reach out to office if new, worrisome, or unresolved symptoms arise or if no improvement in patient's condition. Patient verbalized understanding and is agreeable to treatment plan. All questions answered to patient's satisfaction.    Thersia Schuyler Stark, [place]

## 2024-02-23 ENCOUNTER — Ambulatory Visit (HOSPITAL_BASED_OUTPATIENT_CLINIC_OR_DEPARTMENT_OTHER): Payer: Self-pay | Admitting: Family Medicine

## 2024-02-23 DIAGNOSIS — E781 Pure hyperglyceridemia: Secondary | ICD-10-CM

## 2024-02-23 DIAGNOSIS — E7849 Other hyperlipidemia: Secondary | ICD-10-CM

## 2024-02-23 LAB — LIPID PANEL
Chol/HDL Ratio: 6.5 ratio — ABNORMAL HIGH (ref 0.0–5.0)
Cholesterol, Total: 226 mg/dL — ABNORMAL HIGH (ref 100–199)
HDL: 35 mg/dL — ABNORMAL LOW (ref >39)
LDL Chol Calc (NIH): 158 mg/dL — ABNORMAL HIGH (ref 0–99)
Triglycerides: 179 mg/dL — ABNORMAL HIGH (ref 0–149)
VLDL Cholesterol Cal: 33 mg/dL (ref 5–40)

## 2024-02-23 LAB — COMPREHENSIVE METABOLIC PANEL WITH GFR
ALT: 25 IU/L (ref 0–44)
AST: 18 IU/L (ref 0–40)
Albumin: 4.4 g/dL (ref 4.1–5.1)
Alkaline Phosphatase: 73 IU/L (ref 47–123)
BUN/Creatinine Ratio: 16 (ref 9–20)
BUN: 16 mg/dL (ref 6–20)
Bilirubin Total: 0.2 mg/dL (ref 0.0–1.2)
CO2: 19 mmol/L — ABNORMAL LOW (ref 20–29)
Calcium: 9.4 mg/dL (ref 8.7–10.2)
Chloride: 102 mmol/L (ref 96–106)
Creatinine, Ser: 1.01 mg/dL (ref 0.76–1.27)
Globulin, Total: 2.7 g/dL (ref 1.5–4.5)
Glucose: 88 mg/dL (ref 70–99)
Potassium: 4.5 mmol/L (ref 3.5–5.2)
Sodium: 137 mmol/L (ref 134–144)
Total Protein: 7.1 g/dL (ref 6.0–8.5)
eGFR: 98 mL/min/1.73 (ref >59)

## 2024-02-23 LAB — CBC WITH DIFFERENTIAL/PLATELET
Basophils Absolute: 0.1 x10E3/uL (ref 0.0–0.2)
Basos: 1 %
EOS (ABSOLUTE): 0.2 x10E3/uL (ref 0.0–0.4)
Eos: 3 %
Hematocrit: 45.8 % (ref 37.5–51.0)
Hemoglobin: 14.5 g/dL (ref 13.0–17.7)
Immature Grans (Abs): 0 x10E3/uL (ref 0.0–0.1)
Immature Granulocytes: 0 %
Lymphocytes Absolute: 1.5 x10E3/uL (ref 0.7–3.1)
Lymphs: 23 %
MCH: 26.4 pg — ABNORMAL LOW (ref 26.6–33.0)
MCHC: 31.7 g/dL (ref 31.5–35.7)
MCV: 83 fL (ref 79–97)
Monocytes Absolute: 0.4 x10E3/uL (ref 0.1–0.9)
Monocytes: 7 %
Neutrophils Absolute: 4.4 x10E3/uL (ref 1.4–7.0)
Neutrophils: 65 %
Platelets: 311 x10E3/uL (ref 150–450)
RBC: 5.5 x10E6/uL (ref 4.14–5.80)
RDW: 13.5 % (ref 11.6–15.4)
WBC: 6.7 x10E3/uL (ref 3.4–10.8)

## 2024-02-23 LAB — HEMOGLOBIN A1C
Est. average glucose Bld gHb Est-mCnc: 111 mg/dL
Hgb A1c MFr Bld: 5.5 % (ref 4.8–5.6)

## 2024-02-23 LAB — TSH: TSH: 2.36 u[IU]/mL (ref 0.450–4.500)

## 2024-02-23 NOTE — Progress Notes (Signed)
 Hi Ronnie Baker, Your blood counts are stable.  Your electrolytes, kidney, and liver function are stable and improved from last year.  Your cholesterol is significantly elevated compared to last check.  Were you fasting at the time of your labs?  Are you taking any current supplements at this time?  Your thyroid  function is stable and A1c is stable at 5.5 which is out of the prediabetes range.

## 2024-02-24 ENCOUNTER — Other Ambulatory Visit (HOSPITAL_BASED_OUTPATIENT_CLINIC_OR_DEPARTMENT_OTHER): Payer: Self-pay | Admitting: Family Medicine

## 2024-02-24 ENCOUNTER — Other Ambulatory Visit (HOSPITAL_BASED_OUTPATIENT_CLINIC_OR_DEPARTMENT_OTHER): Payer: Self-pay

## 2024-02-24 DIAGNOSIS — E7849 Other hyperlipidemia: Secondary | ICD-10-CM

## 2024-02-24 MED ORDER — EZETIMIBE 10 MG PO TABS
10.0000 mg | ORAL_TABLET | Freq: Every day | ORAL | 3 refills | Status: AC
  Filled 2024-02-24: qty 30, 30d supply, fill #0
  Filled 2024-04-06: qty 30, 30d supply, fill #1
  Filled 2024-05-17: qty 30, 30d supply, fill #2

## 2024-02-25 ENCOUNTER — Other Ambulatory Visit (HOSPITAL_BASED_OUTPATIENT_CLINIC_OR_DEPARTMENT_OTHER): Payer: Self-pay

## 2024-02-29 ENCOUNTER — Ambulatory Visit (INDEPENDENT_AMBULATORY_CARE_PROVIDER_SITE_OTHER): Admitting: Otolaryngology

## 2024-02-29 ENCOUNTER — Encounter (INDEPENDENT_AMBULATORY_CARE_PROVIDER_SITE_OTHER): Payer: Self-pay | Admitting: Otolaryngology

## 2024-02-29 VITALS — BP 128/83 | HR 98 | Ht 68.0 in | Wt 248.0 lb

## 2024-02-29 DIAGNOSIS — J342 Deviated nasal septum: Secondary | ICD-10-CM | POA: Diagnosis not present

## 2024-02-29 DIAGNOSIS — R0981 Nasal congestion: Secondary | ICD-10-CM

## 2024-02-29 DIAGNOSIS — J343 Hypertrophy of nasal turbinates: Secondary | ICD-10-CM | POA: Diagnosis not present

## 2024-02-29 DIAGNOSIS — J3489 Other specified disorders of nose and nasal sinuses: Secondary | ICD-10-CM | POA: Diagnosis not present

## 2024-02-29 NOTE — Progress Notes (Signed)
 Dear Dr. Knute, Here is my assessment for our mutual patient, Ronnie Baker. Thank you for allowing me the opportunity to care for your patient. Please do not hesitate to contact me should you have any other questions. Sincerely, Dr. Eldora Blanch  Otolaryngology Clinic Note  HISTORY:  Initial visit (02/2024) Discussed the use of AI scribe software for clinical note transcription with the patient, who gave verbal consent to proceed.  History of Present Illness Ronnie Baker is a 38 year old male who presents with chronic nasal congestion and snoring.  He experiences persistent nasal congestion, primarily affecting the left side, with constant obstruction both day and night. Getting worse. This congestion accompanies significant snoring, which has worsened over time.  He has tried nasal strips and nasal dilators without relief. Afrin temporarily opens the right nostril but not the left. Flonase is used seasonally for allergy symptoms. No allergy testing or nasal surgeries have been performed. Has also tried navage without benefit  He denies nasal trauma or frequent sinus infections. He has no smoking history. His father uses a CPAP machine for sleep apnea, but he has not undergone a sleep study. No frequent sinus infections, facial pain, or discolored nasal drainage are reported.   Allergy testing has not been done. No previous sinonasal surgery.  He is currently using no nasal medications  GLP-1: no AP/AC: no  Tobacco: no  PMHx: Neuropathy, HLD  RADIOGRAPHIC EVALUATION AND INDEPENDENT REVIEW OF OTHER RECORDS:: Alexis Caudle (04/12/2023): noted problems with snoring and nasal congestion at night; Dx: Congestion; Rx: ref to ENT Labs CBC and CMP 02/22/2024: WBC 6.7, Eos 200; BUN/Cr 16/1.01 Alexis Caudle 02/22/2024: annual physical, noted septum midline, no sinus tenderness Past Medical History:  Diagnosis Date   Closed dislocation of right patella 04/08/2015   High  cholesterol    HLD (hyperlipidemia)    IFG (impaired fasting glucose)    Past Surgical History:  Procedure Laterality Date   HERNIA REPAIR     x 2 hernia   INSERTION OF MESH  06/08/2012   Procedure: INSERTION OF MESH;  Surgeon: Vicenta DELENA Poli, MD;  Location: [facility] OR;  Service: General;  Laterality: N/A;   UMBILICAL HERNIA REPAIR  06/08/2012   Procedure: HERNIA REPAIR UMBILICAL ADULT;  Surgeon: Vicenta DELENA Poli, MD;  Location: [facility] OR;  Service: General;  Laterality: N/A;   Family History  Problem Relation Age of Onset   Hypertension Mother    High Cholesterol Mother    Asthma Father    Diabetes Maternal Grandmother    Cancer Maternal Grandmother        lung   Cancer Maternal Grandfather        gastric   Diabetes Paternal Grandmother    Social History   Tobacco Use   Smoking status: Never    Passive exposure: Never   Smokeless tobacco: Never  Substance Use Topics   Alcohol use: Yes    Comment: rare   Allergies  Allergen Reactions   Bee Venom Hives and Swelling   Statins Other (See Comments)    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     ezetimibe (ZETIA) 10 MG tablet Take 1 tablet (10 mg total) by mouth daily. 30 tablet 3   fluticasone (FLONASE) 50 MCG/ACT nasal spray Place into both nostrils daily.     ibuprofen (ADVIL) 800 MG tablet Take 800  mg by mouth every 8 (eight) hours as needed for mild pain (pain score 1-3) or moderate pain (pain score 4-6).     Omega 3 1000 MG CAPS Take 1,000 mg by mouth daily.     No current facility-administered medications for this visit.   BP 128/83 (BP Location: Left Arm, Patient Position: Sitting, Cuff Size: Large)   Pulse 98   Ht 5' 8 (1.727 m)   Wt 248 lb (112.5 kg)   SpO2 95%   BMI 37.71 kg/m   PHYSICAL EXAM:  BP 128/83 (BP Location: Left Arm, Patient Position: Sitting, Cuff Size: Large)   Pulse  98   Ht 5' 8 (1.727 m)   Wt 248 lb (112.5 kg)   SpO2 95%   BMI 37.71 kg/m    Salient findings:  CN II-XII intact Bilateral EAC clear and TM intact with well pneumatized middle ear spaces Nose: Anterior rhinoscopy reveals septum deviates left, bilateral inferior turbinate hypertrophy R>L.  Nasal endoscopy was indicated to better evaluate the nose and paranasal sinuses, given the patient's history and exam findings, and is detailed below. No lesions of oral cavity/oropharynx No obviously palpable neck masses/lymphadenopathy/thyromegaly No respiratory distress or stridor  PROCEDURE:  Prior to initiating any procedures, risks/benefits/alternatives were explained to the patient and verbal consent obtained. Diagnostic Nasal Endoscopy Pre-procedure diagnosis: Concern for nasal obstruction, nasal congestion,  Post-procedure diagnosis: same Indication: See pre-procedure diagnosis and physical exam above Complications: None apparent EBL: 0 mL Anesthesia: Lidocaine  4% and topical decongestant was topically sprayed in each nasal cavity  Description of Procedure:  Patient was identified. A rigid 30 degree endoscope was utilized to evaluate the sinonasal cavities, mucosa, sinus ostia and turbinates and septum.  Overall, signs of mucosal inflammation are not noted. No mucopurulence, polyps, or masses noted.   Right Middle meatus: clear Right SE Recess: clear Left MM: clear Left SE Recess: clear  Photodocumentation was obtained.  CPT CODE -- 31231 - Mod 25   ASSESSMENT:  39 y.o. with:  1. Nasal septal deviation   2. Nasal congestion   3. Hypertrophy of both inferior nasal turbinates   4. Nasal obstruction    Clear structural issue with significant septal deviation and turbinate hypertrophy. Has tried medical management essentially maximally without benefit. Clear structural issue.  We discussed the goals of septoplasty and turbinate reduction, and expectations for postoperative  management. Will plan to leave splints in place, and removal was also discussed. We also discussed nasal obstruction post-operatively until splints in place and pain management.  We discussed R/B/A including pain, infection, bleeding (~2% risk of operative visit for control), persistent symptoms, need for revision surgery, and other risks including damage to surrounding structures, septal perforation (<1%), anesthetic complications, among others.  We discussed use of nasal saline spray and nasal saline irrigations post-operatively We also discussed use of intranasal steroid post-operatively until healing occurs Patient understands and is ready to proceed.  - Case posted for septo/turbs - f/u POD 5  See below regarding exact medications prescribed this encounter including dosages and route: No orders of the defined types were placed in this encounter.    Thank you for allowing me the opportunity to care for your patient. Please do not hesitate to contact me should you have any other questions.  Sincerely, Eldora Blanch, MD Otolaryngologist (ENT), [facility] Phone: [phone] Fax: [phone]  MDM:  Level 4: [phone] Complexity/Problems addressed: mod Data complexity: mod - independent interpretation of notes, labs - Morbidity: mod  - decision  for surgery - Prescription Drug prescribed or managed: n  02/29/2024, 10:51 AM

## 2024-02-29 NOTE — Patient Instructions (Addendum)
 Endoscopic Septoplasty and Inferior Turbinate Reduction CPT 30520, CPT 30140-50 (Codes for insurance company)

## 2024-05-26 ENCOUNTER — Other Ambulatory Visit (HOSPITAL_BASED_OUTPATIENT_CLINIC_OR_DEPARTMENT_OTHER): Payer: Self-pay | Admitting: *Deleted

## 2024-05-26 ENCOUNTER — Ambulatory Visit (HOSPITAL_BASED_OUTPATIENT_CLINIC_OR_DEPARTMENT_OTHER)

## 2024-05-26 DIAGNOSIS — E7849 Other hyperlipidemia: Secondary | ICD-10-CM

## 2024-05-26 DIAGNOSIS — E781 Pure hyperglyceridemia: Secondary | ICD-10-CM

## 2024-05-26 LAB — LIPID PANEL
Chol/HDL Ratio: 5 ratio (ref 0.0–5.0)
Cholesterol, Total: 198 mg/dL (ref 100–199)
HDL: 40 mg/dL
LDL Chol Calc (NIH): 128 mg/dL — ABNORMAL HIGH (ref 0–99)
Triglycerides: 167 mg/dL — ABNORMAL HIGH (ref 0–149)
VLDL Cholesterol Cal: 30 mg/dL (ref 5–40)

## 2024-05-29 ENCOUNTER — Ambulatory Visit (HOSPITAL_BASED_OUTPATIENT_CLINIC_OR_DEPARTMENT_OTHER): Payer: Self-pay | Admitting: Family Medicine

## 2024-05-29 NOTE — Progress Notes (Signed)
 Hi Ronnie Baker,  Your total cholesterol, VLDL, HDL, and LDL has improved with the Zetia . Your LDL and Triglycerides are still elevated. I would recommend Zetia  use with a  low dose statin and could try a different brand name if you would like given intolerance to Lipitor. If not, use of Omega 3 fish oil supplement, CoQ10 and heart healthy diet with exercise will also help. I would recommend rechecking this in 6 months. Please call to schedule appointment.

## 2024-05-29 NOTE — Telephone Encounter (Signed)
 Please see new mychart about medication side effects and advise.

## 2024-06-26 ENCOUNTER — Encounter (INDEPENDENT_AMBULATORY_CARE_PROVIDER_SITE_OTHER): Admitting: Otolaryngology

## 2025-02-19 ENCOUNTER — Ambulatory Visit (HOSPITAL_BASED_OUTPATIENT_CLINIC_OR_DEPARTMENT_OTHER): Admit: 2025-02-19 | Admitting: Otolaryngology

## 2025-02-19 ENCOUNTER — Encounter (HOSPITAL_BASED_OUTPATIENT_CLINIC_OR_DEPARTMENT_OTHER): Payer: Self-pay

## 2025-02-19 SURGERY — SEPTOPLASTY, NOSE, WITH NASAL TURBINATE REDUCTION
Anesthesia: General | Laterality: Bilateral

## 2025-02-26 ENCOUNTER — Encounter (INDEPENDENT_AMBULATORY_CARE_PROVIDER_SITE_OTHER): Admitting: Otolaryngology
